# Patient Record
Sex: Female | Born: 1995 | Race: Black or African American | Hispanic: No | Marital: Single | Smoking: Never smoker
Health system: Southern US, Community
[De-identification: ages and names within clinical notes are randomized; demographics above are authoritative.]

## PROBLEM LIST (undated history)

## (undated) DIAGNOSIS — F1010 Alcohol abuse, uncomplicated: Secondary | ICD-10-CM

## (undated) DIAGNOSIS — Z789 Other specified health status: Secondary | ICD-10-CM

## (undated) HISTORY — PX: NO PAST SURGERIES: SHX2092

---

## 2019-06-04 ENCOUNTER — Other Ambulatory Visit: Payer: Self-pay

## 2019-06-04 ENCOUNTER — Inpatient Hospital Stay (HOSPITAL_COMMUNITY): Payer: Medicaid Other

## 2019-06-04 ENCOUNTER — Observation Stay (HOSPITAL_COMMUNITY)
Admission: AD | Admit: 2019-06-04 | Discharge: 2019-06-07 | Disposition: A | Discharge status: Home / Self Care | Payer: Medicaid Other | Attending: Obstetrics & Gynecology | Admitting: Obstetrics & Gynecology

## 2019-06-04 ENCOUNTER — Encounter (HOSPITAL_COMMUNITY): Payer: Self-pay | Admitting: Student

## 2019-06-04 DIAGNOSIS — Z1159 Encounter for screening for other viral diseases: Secondary | ICD-10-CM | POA: Insufficient documentation

## 2019-06-04 DIAGNOSIS — R109 Unspecified abdominal pain: Secondary | ICD-10-CM | POA: Diagnosis present

## 2019-06-04 DIAGNOSIS — O210 Mild hyperemesis gravidarum: Principal | ICD-10-CM | POA: Diagnosis present

## 2019-06-04 DIAGNOSIS — F1290 Cannabis use, unspecified, uncomplicated: Secondary | ICD-10-CM | POA: Diagnosis not present

## 2019-06-04 DIAGNOSIS — M549 Dorsalgia, unspecified: Secondary | ICD-10-CM | POA: Diagnosis present

## 2019-06-04 DIAGNOSIS — Z3A10 10 weeks gestation of pregnancy: Secondary | ICD-10-CM | POA: Diagnosis not present

## 2019-06-04 DIAGNOSIS — O99321 Drug use complicating pregnancy, first trimester: Secondary | ICD-10-CM | POA: Insufficient documentation

## 2019-06-04 DIAGNOSIS — O26891 Other specified pregnancy related conditions, first trimester: Secondary | ICD-10-CM | POA: Diagnosis not present

## 2019-06-04 DIAGNOSIS — R400 Somnolence: Secondary | ICD-10-CM | POA: Diagnosis present

## 2019-06-04 DIAGNOSIS — R1013 Epigastric pain: Secondary | ICD-10-CM | POA: Diagnosis not present

## 2019-06-04 DIAGNOSIS — Z3689 Encounter for other specified antenatal screening: Secondary | ICD-10-CM | POA: Diagnosis not present

## 2019-06-04 DIAGNOSIS — O219 Vomiting of pregnancy, unspecified: Secondary | ICD-10-CM | POA: Diagnosis not present

## 2019-06-04 HISTORY — DX: Other specified health status: Z78.9

## 2019-06-04 HISTORY — DX: Alcohol abuse, uncomplicated: F10.10

## 2019-06-04 LAB — CBC
HCT: 38.1 % (ref 36.0–46.0)
HCT: 38.4 % (ref 36.0–46.0)
Hemoglobin: 13 g/dL (ref 12.0–15.0)
Hemoglobin: 13.1 g/dL (ref 12.0–15.0)
MCH: 27.5 pg (ref 26.0–34.0)
MCH: 27.6 pg (ref 26.0–34.0)
MCHC: 34.1 g/dL (ref 30.0–36.0)
MCHC: 34.1 g/dL (ref 30.0–36.0)
MCV: 80.7 fL (ref 80.0–100.0)
MCV: 81 fL (ref 80.0–100.0)
Platelets: 276 10*3/uL (ref 150–400)
Platelets: 271 10*3/uL (ref 150–400)
RBC: 4.72 MIL/uL (ref 3.87–5.11)
RBC: 4.74 MIL/uL (ref 3.87–5.11)
RDW: 14 % (ref 11.5–15.5)
RDW: 14.1 % (ref 11.5–15.5)
WBC: 17 10*3/uL — ABNORMAL HIGH (ref 4.0–10.5)
WBC: 16.8 10*3/uL — ABNORMAL HIGH (ref 4.0–10.5)
nRBC: 0 % (ref 0.0–0.2)
nRBC: 0 % (ref 0.0–0.2)

## 2019-06-04 LAB — COMPREHENSIVE METABOLIC PANEL
ALT: 47 U/L — ABNORMAL HIGH (ref 0–44)
AST: 21 U/L (ref 15–41)
Albumin: 4.2 g/dL (ref 3.5–5.0)
Alkaline Phosphatase: 57 U/L (ref 38–126)
Anion gap: 10 (ref 5–15)
BUN: 7 mg/dL (ref 6–20)
CO2: 25 mmol/L (ref 22–32)
Calcium: 9.6 mg/dL (ref 8.9–10.3)
Chloride: 99 mmol/L (ref 98–111)
Creatinine, Ser: 0.6 mg/dL (ref 0.44–1.00)
GFR calc Af Amer: >60 mL/min (ref >60)
GFR calc non Af Amer: >60 mL/min (ref >60)
Glucose, Bld: 107 mg/dL — ABNORMAL HIGH (ref 70–99)
Potassium: 3.1 mmol/L — ABNORMAL LOW (ref 3.5–5.1)
Sodium: 134 mmol/L — ABNORMAL LOW (ref 135–145)
Total Bilirubin: 0.3 mg/dL (ref 0.3–1.2)
Total Protein: 7.4 g/dL (ref 6.5–8.1)

## 2019-06-04 LAB — URINALYSIS, ROUTINE W REFLEX MICROSCOPIC
Bilirubin Urine: NEGATIVE
Glucose, UA: NEGATIVE mg/dL
Hgb urine dipstick: NEGATIVE
Ketones, ur: 80 mg/dL — AB
Nitrite: NEGATIVE
Protein, ur: 100 mg/dL — AB
Specific Gravity, Urine: 1.031 — ABNORMAL HIGH (ref 1.005–1.030)
pH: 5 (ref 5.0–8.0)

## 2019-06-04 LAB — DIFFERENTIAL
Abs Immature Granulocytes: 0.09 10*3/uL — ABNORMAL HIGH (ref 0.00–0.07)
Basophils Absolute: 0 10*3/uL (ref 0.0–0.1)
Basophils Relative: 0 %
Eosinophils Absolute: 0 10*3/uL (ref 0.0–0.5)
Eosinophils Relative: 0 %
Immature Granulocytes: 1 %
Lymphocytes Relative: 9 %
Lymphs Abs: 1.5 10*3/uL (ref 0.7–4.0)
Monocytes Absolute: 0.6 10*3/uL (ref 0.1–1.0)
Monocytes Relative: 3 %
Neutro Abs: 14.6 10*3/uL — ABNORMAL HIGH (ref 1.7–7.7)
Neutrophils Relative %: 87 %

## 2019-06-04 LAB — BLOOD GAS, ARTERIAL
Acid-Base Excess: 1.6 mmol/L (ref 0.0–2.0)
Bicarbonate: 24.3 mmol/L (ref 20.0–28.0)
Drawn by: 560071
O2 Saturation: 98.4 %
pCO2 arterial: 32.9 mmHg (ref 32.0–48.0)
pH, Arterial: 7.482 — ABNORMAL HIGH (ref 7.350–7.450)
pO2, Arterial: 92.3 mmHg (ref 83.0–108.0)

## 2019-06-04 LAB — RAPID URINE DRUG SCREEN, HOSP PERFORMED
Amphetamines: NOT DETECTED
Barbiturates: NOT DETECTED
Benzodiazepines: NOT DETECTED
Cocaine: NOT DETECTED
Opiates: NOT DETECTED
Tetrahydrocannabinol: POSITIVE — AB

## 2019-06-04 LAB — WET PREP, GENITAL
Sperm: NONE SEEN
Trich, Wet Prep: NONE SEEN
Yeast Wet Prep HPF POC: NONE SEEN

## 2019-06-04 LAB — LIPASE, BLOOD: Lipase: 60 U/L — ABNORMAL HIGH (ref 11–51)

## 2019-06-04 LAB — LACTIC ACID, PLASMA: Lactic Acid, Venous: 1.2 mmol/L (ref 0.5–1.9)

## 2019-06-04 LAB — AMYLASE: Amylase: 129 U/L — ABNORMAL HIGH (ref 28–100)

## 2019-06-04 LAB — C-REACTIVE PROTEIN: CRP: <0.8 mg/dL (ref <1.0)

## 2019-06-04 LAB — AMMONIA: Ammonia: 31 umol/L (ref 9–35)

## 2019-06-04 LAB — TRIGLYCERIDES
Triglycerides: 53 mg/dL (ref <150)
Triglycerides: 57 mg/dL (ref <150)

## 2019-06-04 LAB — SARS CORONAVIRUS 2 BY RT PCR (HOSPITAL ORDER, PERFORMED IN ~~LOC~~ HOSPITAL LAB): SARS Coronavirus 2: NEGATIVE

## 2019-06-04 LAB — MAGNESIUM: Magnesium: 1.7 mg/dL (ref 1.7–2.4)

## 2019-06-04 LAB — ETHANOL: Alcohol, Ethyl (B): <10 mg/dL (ref <10)

## 2019-06-04 MED ORDER — ACETAMINOPHEN 325 MG PO TABS: 650.0000 mg | ORAL_TABLET | ORAL | Status: DC | PRN

## 2019-06-04 MED ORDER — POTASSIUM CHLORIDE CRYS ER 20 MEQ PO TBCR
20.0000 meq | EXTENDED_RELEASE_TABLET | Freq: Two times a day (BID) | ORAL | Status: DC
  Administered 2019-06-05 – 2019-06-07 (×6): 20 meq via ORAL
  Filled 2019-06-04 (×7): qty 1

## 2019-06-04 MED ORDER — FAMOTIDINE IN NACL 20-0.9 MG/50ML-% IV SOLN
20.0000 mg | Freq: Once | INTRAVENOUS | Status: AC
  Administered 2019-06-04: 20 mg via INTRAVENOUS
  Filled 2019-06-04: qty 50

## 2019-06-04 MED ORDER — LACTATED RINGERS IV BOLUS
1000.0000 mL | Freq: Once | INTRAVENOUS | Status: AC
  Administered 2019-06-04: 1000 mL via INTRAVENOUS

## 2019-06-04 MED ORDER — DOCUSATE SODIUM 100 MG PO CAPS
100.0000 mg | ORAL_CAPSULE | Freq: Every day | ORAL | Status: DC
  Administered 2019-06-05 – 2019-06-07 (×3): 100 mg via ORAL
  Filled 2019-06-04 (×3): qty 1

## 2019-06-04 MED ORDER — PROMETHAZINE HCL 25 MG/ML IJ SOLN
25.0000 mg | Freq: Four times a day (QID) | INTRAMUSCULAR | Status: DC | PRN
  Administered 2019-06-04: 25 mg via INTRAVENOUS
  Filled 2019-06-04: qty 1

## 2019-06-04 MED ORDER — LACTATED RINGERS IV SOLN
INTRAVENOUS | Status: DC
  Administered 2019-06-04 – 2019-06-07 (×7): via INTRAVENOUS

## 2019-06-04 MED ORDER — PANTOPRAZOLE SODIUM 40 MG IV SOLR
40.0000 mg | INTRAVENOUS | Status: DC
  Administered 2019-06-05: 40 mg via INTRAVENOUS

## 2019-06-04 MED ORDER — CALCIUM CARBONATE ANTACID 500 MG PO CHEW
2.0000 | CHEWABLE_TABLET | ORAL | Status: DC | PRN
  Administered 2019-06-05: 400 mg via ORAL
  Filled 2019-06-04: qty 2

## 2019-06-04 MED ORDER — PRENATAL MULTIVITAMIN CH
1.0000 | ORAL_TABLET | Freq: Every day | ORAL | Status: DC
  Administered 2019-06-05 – 2019-06-07 (×3): 1 via ORAL
  Filled 2019-06-04 (×3): qty 1

## 2019-06-04 MED ORDER — CYCLOBENZAPRINE HCL 10 MG PO TABS
10.0000 mg | ORAL_TABLET | Freq: Three times a day (TID) | ORAL | Status: DC | PRN
  Filled 2019-06-04: qty 1

## 2019-06-04 NOTE — Progress Notes (Signed)
Pt arrived via wheelchair accompanied by NT from MAU to room 102.

## 2019-06-04 NOTE — ED Triage Notes (Signed)
Transport notified to take to MAU-- MAU notified

## 2019-06-04 NOTE — MAU Note (Signed)
Went into room to update pt history. Pt very sleepy and lethargic. Verbally responds after shaking pts arm. Still sleepy and dosing off during interview.

## 2019-06-04 NOTE — ED Notes (Signed)
Called for Patient 2 times no answer.

## 2019-06-04 NOTE — ED Provider Notes (Signed)
MSE was initiated and I personally evaluated the patient and placed orders (if any) at  2:53 PM on June 04, 2019.  Natalie Henson is a 23 y.o. female who reports positive pregnancy test at [facility] ED 1 week ago.  She presents today reporting periumbilical abdominal pain and back pain as well as nausea and vomiting.  She has not had any vaginal bleeding.  Vitals are stable.  Patient is in no acute distress.  Feel she is stable for evaluation at MAU as a pregnant patient.  I called and discussed this with MAU NP Erin who is in agreement and will see and treat patient.  Patient will be transferred to [facility] at this time.  The patient appears stable so that the remainder of the MSE may be completed by another provider.   Jacqlyn Larsen, PA-C 06/04/19 [place], MD 06/08/19 [id number]

## 2019-06-04 NOTE — MAU Provider Note (Addendum)
History     CSN: [id number]  Arrival date and time: 06/04/19 1416   First Provider Initiated Contact with Patient 06/04/19 1649      Chief Complaint  Patient presents with  . Abdominal Pain   23 y.o. G2P1001 @10 .2 wls presenting with N/V, abdominal and back pain. Sx started 8 days ago. She was seen in another ED and given Diclegis but states is not working. Reports 6 episodes of emesis today. Cannot tolerate food or fluids. Abd pain is located in epigastric region. Rates 10/10. Endorses back pain, down the spine. Denies falls or injuries. Rates 10/10. Took Tylenol a few days ago. Denies urinary sx. No VB. Admit to MJ use to help appetite. Used yesterday. Hx of ETOH abuse.  OB History    Gravida  2   Para  1   Term  1   Preterm      AB      Living  1     SAB      TAB      Ectopic      Multiple      Live Births  1           Past Medical History:  Diagnosis Date  . Alcohol abuse   . Medical history non-contributory     Past Surgical History:  Procedure Laterality Date  . NO PAST SURGERIES      No family history on file.  Social History   Tobacco Use  . Smoking status: Never Smoker  Substance Use Topics  . Alcohol use: Never    Frequency: Never  . Drug use: Yes    Types: Marijuana    Comment: used 2 days ago    Allergies: No Known Allergies  No medications prior to admission.    Review of Systems  Constitutional: Negative for chills and fever.  Gastrointestinal: Positive for abdominal pain, nausea and vomiting.  Genitourinary: Negative for dysuria, hematuria, urgency, vaginal bleeding and vaginal discharge.  Musculoskeletal: Positive for back pain.   Physical Exam   Blood pressure 127/63, pulse 93, temperature 99.2 F (37.3 C), resp. rate 18, weight 75.8 kg, last menstrual period 03/09/2019, SpO2 100 %.  Physical Exam  Nursing note and vitals reviewed. Constitutional: She is oriented to person, place, and time. She appears  well-developed and well-nourished. She appears lethargic. No distress.  HENT:  Head: Normocephalic and atraumatic.  Neck: Normal range of motion.  Cardiovascular: Normal rate.  Respiratory: Effort normal. No respiratory distress.  GI: Soft. She exhibits no distension and no mass. There is abdominal tenderness in the epigastric area. There is no rebound and no guarding.  Musculoskeletal: Normal range of motion.     Cervical back: Normal.     Thoracic back: Normal. She exhibits no deformity.     Lumbar back: She exhibits tenderness. She exhibits no deformity.  Neurological: She is oriented to person, place, and time. She appears lethargic.  Skin: Skin is warm and dry.  Psychiatric: She has a normal mood and affect.  Bedside US: Viable IUP, FHR 172  Results for orders placed or performed during the hospital encounter of 06/04/19 (from the past 24 hour(s))  Urinalysis, Routine w reflex microscopic     Status: Abnormal   Collection Time: 06/04/19  4:10 PM  Result Value Ref Range   Color, Urine AMBER (A) YELLOW   APPearance HAZY (A) CLEAR   Specific Gravity, Urine 1.031 (H) 1.005 - 1.030   pH 5.0 5.0 - 8.0  Glucose, UA NEGATIVE NEGATIVE mg/dL   Hgb urine dipstick NEGATIVE NEGATIVE   Bilirubin Urine NEGATIVE NEGATIVE   Ketones, ur 80 (A) NEGATIVE mg/dL   Protein, ur 161100 (A) NEGATIVE mg/dL   Nitrite NEGATIVE NEGATIVE   Leukocytes,Ua TRACE (A) NEGATIVE   RBC / HPF 0-5 0 - 5 RBC/hpf   WBC, UA 11-20 0 - 5 WBC/hpf   Bacteria, UA RARE (A) NONE SEEN   Squamous Epithelial / LPF 11-20 0 - 5   Mucus PRESENT   Urine rapid drug screen (hosp performed)     Status: Abnormal   Collection Time: 06/04/19  5:02 PM  Result Value Ref Range   Opiates NONE DETECTED NONE DETECTED   Cocaine NONE DETECTED NONE DETECTED   Benzodiazepines NONE DETECTED NONE DETECTED   Amphetamines NONE DETECTED NONE DETECTED   Tetrahydrocannabinol POSITIVE (A) NONE DETECTED   Barbiturates NONE DETECTED NONE DETECTED  CBC      Status: Abnormal   Collection Time: 06/04/19  5:50 PM  Result Value Ref Range   WBC 17.0 (H) 4.0 - 10.5 K/uL   RBC 4.72 3.87 - 5.11 MIL/uL   Hemoglobin 13.0 12.0 - 15.0 g/dL   HCT 09.638.1 04.536.0 - 40.946.0 %   MCV 80.7 80.0 - 100.0 fL   MCH 27.5 26.0 - 34.0 pg   MCHC 34.1 30.0 - 36.0 g/dL   RDW 81.114.0 91.411.5 - 78.215.5 %   Platelets 276 150 - 400 K/uL   nRBC 0.0 0.0 - 0.2 %  Comprehensive metabolic panel     Status: Abnormal   Collection Time: 06/04/19  5:50 PM  Result Value Ref Range   Sodium 134 (L) 135 - 145 mmol/L   Potassium 3.1 (L) 3.5 - 5.1 mmol/L   Chloride 99 98 - 111 mmol/L   CO2 25 22 - 32 mmol/L   Glucose, Bld 107 (H) 70 - 99 mg/dL   BUN 7 6 - 20 mg/dL   Creatinine, Ser 9.560.60 0.44 - 1.00 mg/dL   Calcium 9.6 8.9 - 21.310.3 mg/dL   Total Protein 7.4 6.5 - 8.1 g/dL   Albumin 4.2 3.5 - 5.0 g/dL   AST 21 15 - 41 U/L   ALT 47 (H) 0 - 44 U/L   Alkaline Phosphatase 57 38 - 126 U/L   Total Bilirubin 0.3 0.3 - 1.2 mg/dL   GFR calc non Af Amer >60 >60 mL/min   GFR calc Af Amer >60 >60 mL/min   Anion gap 10 5 - 15  Lipase, blood     Status: Abnormal   Collection Time: 06/04/19  5:50 PM  Result Value Ref Range   Lipase 60 (H) 11 - 51 U/L  Ethanol     Status: None   Collection Time: 06/04/19  5:50 PM  Result Value Ref Range   Alcohol, Ethyl (B) <10 <10 mg/dL  Wet prep, genital     Status: Abnormal   Collection Time: 06/04/19  6:51 PM   Specimen: Vaginal  Result Value Ref Range   Yeast Wet Prep HPF POC NONE SEEN NONE SEEN   Trich, Wet Prep NONE SEEN NONE SEEN   Clue Cells Wet Prep HPF POC PRESENT (A) NONE SEEN   WBC, Wet Prep HPF POC MANY (A) NONE SEEN   Sperm NONE SEEN   Differential     Status: Abnormal   Collection Time: 06/04/19  8:14 PM  Result Value Ref Range   Neutrophils Relative % 87 %   Neutro Abs 14.6 (H) 1.7 -  7.7 K/uL   Lymphocytes Relative 9 %   Lymphs Abs 1.5 0.7 - 4.0 K/uL   Monocytes Relative 3 %   Monocytes Absolute 0.6 0.1 - 1.0 K/uL   Eosinophils Relative 0 %    Eosinophils Absolute 0.0 0.0 - 0.5 K/uL   Basophils Relative 0 %   Basophils Absolute 0.0 0.0 - 0.1 K/uL   Immature Granulocytes 1 %   Abs Immature Granulocytes 0.09 (H) 0.00 - 0.07 K/uL  CBC     Status: Abnormal   Collection Time: 06/04/19  8:14 PM  Result Value Ref Range   WBC 16.8 (H) 4.0 - 10.5 K/uL   RBC 4.74 3.87 - 5.11 MIL/uL   Hemoglobin 13.1 12.0 - 15.0 g/dL   HCT 40.938.4 81.136.0 - 91.446.0 %   MCV 81.0 80.0 - 100.0 fL   MCH 27.6 26.0 - 34.0 pg   MCHC 34.1 30.0 - 36.0 g/dL   RDW 78.214.1 95.611.5 - 21.315.5 %   Platelets 271 150 - 400 K/uL   nRBC 0.0 0.0 - 0.2 %   [place] Abdomen Complete  Result Date: 06/04/2019 CLINICAL DATA:  Abdominal pain, nausea, and vomiting. EXAM: ABDOMEN ULTRASOUND COMPLETE COMPARISON:  None. FINDINGS: Examination was technically difficult due to motion artifact as well as patient's inability to take a deep breath. Gallbladder: No gallstones or wall thickening visualized. No sonographic Murphy sign noted by sonographer. Common bile duct: Diameter: 2 mm Liver: No focal lesion identified. Within normal limits in parenchymal echogenicity. Portal vein is patent on color Doppler imaging with normal direction of blood flow towards the liver. IVC: No abnormality visualized. Pancreas: Visualized portion unremarkable. Spleen: Size and appearance within normal limits. Right Kidney: Length: 12.1 cm. Echogenicity within normal limits. No mass or hydronephrosis visualized. Left Kidney: Length: 11.1 cm. Echogenicity within normal limits. No mass or hydronephrosis visualized. Abdominal aorta: No aneurysm visualized. Other findings: None. IMPRESSION: Negative abdominal ultrasound. Electronically Signed   By: Sebastian AcheAllen  Grady M.D.   On: 06/04/2019 20:22    MAU Course  Procedures Orders Placed This Encounter  Procedures  . Wet prep, genital    Standing Status:   Standing    Number of Occurrences:   1    Order Specific Question:   Patient immune status    Answer:   Normal  . Culture, OB Urine     Standing Status:   Standing    Number of Occurrences:   1  . US Abdomen Complete    Standing Status:   Standing    Number of Occurrences:   1    Order Specific Question:   Reason for Exam (SYMPTOM  OR DIAGNOSIS REQUIRED)    Answer:   abd pain, N/V  . Urinalysis, Routine w reflex microscopic    Standing Status:   Standing    Number of Occurrences:   1  . CBC    Standing Status:   Standing    Number of Occurrences:   1  . Comprehensive metabolic panel    Standing Status:   Standing    Number of Occurrences:   1  . Lipase, blood    Standing Status:   Standing    Number of Occurrences:   1  . Urine rapid drug screen (hosp performed)    Standing Status:   Standing    Number of Occurrences:   1  . Ethanol    Standing Status:   Standing    Number of Occurrences:   1  . Differential  Standing Status:   Standing    Number of Occurrences:   1  . Amylase    Standing Status:   Standing    Number of Occurrences:   1  . CBC    Standing Status:   Standing    Number of Occurrences:   1   Meds ordered this encounter  Medications  . lactated ringers bolus 1,000 mL  . promethazine (PHENERGAN) injection 25 mg  . famotidine (PEPCID) IVPB 20 mg premix  . DISCONTD: cyclobenzaprine (FLEXERIL) tablet 10 mg   MDM Labs and [place] ordered. Transfer of care given to Betty Daidone, [facility] Julianne Handler, CNM  06/04/2019 8:37 PM   -care assumed of patient @2037  -called and spoke with Dr. Rosana Hoes @2040  re: elevated amylase and normal abdominal US, per Dr. Rosana Hoes, will order ammonia and drug panel by serum as well as additional bag of LR -Dr. Rosana Hoes at bedside @2130  to evaluate pt in person, per Dr. Rosana Hoes, will consult with family medicine and admit patient to [facility] specialty care for observation and additional labs/testing  Assessment and Plan   -pt admitted to [facility] specialty care  Cristel Rail, Gerrie Nordmann, NP  10:14 PM 06/04/2019

## 2019-06-04 NOTE — H&P (Signed)
Obstetric History and Physical  Natalie Henson is a 23 y.o. G2P1001 with IUP at 422w2d presenting for nausea, vomiting, abdominal pain and back pain for about a week. Was seen in another ED and given diclegis but this has not helped. States she has been very nauseous and has been unable to keep down food, although on further questioning, she reports she had dinner last night. Was able to keep down dinner last night but reports pain in stomach has been increasing, rated it 10/10 tonight. Nothing improves her pain.   Also reports upper back pain. Took 1000 mg tylenol today for back pain but it did not help. Reports she has never had back pain like this. Denies trauma/fall. Denies IVDA. Does smoke marijuana, last was about 3 days ago to help with nausea. H/o heavy alcohol use, reports last time she had alcohol was when she was in the hospital for alcohol poisoning, which she thinks was earlier this year but unable to give good history. Told staff earlier she has not had alcohol in 2 months.   Denies vaginal bleeding or cramping, has not had any care in pregnancy thus far. [facility] with singleton IUP at 6572w2d on 05/28/19.   Prenatal Course Source of Care: none Pregnancy complications or risks:There are no active problems to display for this patient.  Medical History:  Past Medical History:  Diagnosis Date  . Alcohol abuse   . Medical history non-contributory     Past Surgical History:  Procedure Laterality Date  . NO PAST SURGERIES      OB History  Gravida Para Term Preterm AB Living  2 1 1     1   SAB TAB Ectopic Multiple Live Births          1    # Outcome Date GA Lbr Len/2nd Weight Sex Delivery Anes PTL Lv  2 Current           1 Term 04/14/17 5029w0d  3705 g F Vag-Spont EPI N LIV     Complications: Delayed postpartum hemorrhage    Social History   Socioeconomic History  . Marital status: Single    Spouse name: Not on file  . Number of children: Not on  file  . Years of education: Not on file  . Highest education level: Not on file  Occupational History  . Not on file  Social Needs  . Financial resource strain: Not on file  . Food insecurity    Worry: Not on file    Inability: Not on file  . Transportation needs    Medical: Not on file    Non-medical: Not on file  Tobacco Use  . Smoking status: Never Smoker  Substance and Sexual Activity  . Alcohol use: Never    Frequency: Never  . Drug use: Yes    Types: Marijuana    Comment: used 2 days ago  . Sexual activity: Not on file  Lifestyle  . Physical activity    Days per week: Not on file    Minutes per session: Not on file  . Stress: Not on file  Relationships  . Social Musicianconnections    Talks on phone: Not on file    Gets together: Not on file    Attends religious service: Not on file    Active member of club or organization: Not on file    Attends meetings of clubs or organizations: Not on file    Relationship status:  Not on file  Other Topics Concern  . Not on file  Social History Narrative  . Not on file   No family history on file.  No medications prior to admission.   No Known Allergies  Review of Systems: Negative except for what is mentioned in HPI.  Physical Exam: BP 127/63   Pulse 93   Temp 99.2 F (37.3 C)   Resp 18   Wt 75.8 kg   LMP 03/09/2019   SpO2 100%  CONSTITUTIONAL: Well-developed, well-nourished female in mild distress with movements, extremely somnolent on arrival, somewhat difficult to awaken but once awake, she is more interactive HENT:  Normocephalic, atraumatic, External right and left ear normal. Oropharynx is clear and moist EYES: Conjunctivae and EOM are normal. Pupils are equal, round, and reactive to light. Eyes appeared to be rolling back in head at one point while speaking with patient but she was able to focus and respond NECK: Normal range of motion, supple, no masses SKIN: Skin is warm and dry. No rash noted. Not diaphoretic.  No erythema. No pallor. NEUROLOGIC: Alert and oriented to person, place, and time. Normal reflexes, muscle tone coordination. No cranial nerve deficit noted. PSYCHIATRIC: Normal mood and affect. Normal judgment and thought content. CARDIOVASCULAR: Normal heart rate noted RESPIRATORY: Effort normal, no problems with respiration noted ABDOMEN: Soft but tender in RUQ and epigastric area, nondistended. Severe back tenderness approx 10 cm long along spine in mid-back, no paraspinal tenderness, no CVA tenderness, no visible abscess MUSCULOSKELETAL: Normal range of motion. No edema and no tenderness. 2+ distal pulses.  Pertinent Labs/Studies:   Results for orders placed or performed during the hospital encounter of 06/04/19 (from the past 24 hour(s))  Urinalysis, Routine w reflex microscopic     Status: Abnormal   Collection Time: 06/04/19  4:10 PM  Result Value Ref Range   Color, Urine AMBER (A) YELLOW   APPearance HAZY (A) CLEAR   Specific Gravity, Urine 1.031 (H) 1.005 - 1.030   pH 5.0 5.0 - 8.0   Glucose, UA NEGATIVE NEGATIVE mg/dL   Hgb urine dipstick NEGATIVE NEGATIVE   Bilirubin Urine NEGATIVE NEGATIVE   Ketones, ur 80 (A) NEGATIVE mg/dL   Protein, ur 161100 (A) NEGATIVE mg/dL   Nitrite NEGATIVE NEGATIVE   Leukocytes,Ua TRACE (A) NEGATIVE   RBC / HPF 0-5 0 - 5 RBC/hpf   WBC, UA 11-20 0 - 5 WBC/hpf   Bacteria, UA RARE (A) NONE SEEN   Squamous Epithelial / LPF 11-20 0 - 5   Mucus PRESENT   Urine rapid drug screen (hosp performed)     Status: Abnormal   Collection Time: 06/04/19  5:02 PM  Result Value Ref Range   Opiates NONE DETECTED NONE DETECTED   Cocaine NONE DETECTED NONE DETECTED   Benzodiazepines NONE DETECTED NONE DETECTED   Amphetamines NONE DETECTED NONE DETECTED   Tetrahydrocannabinol POSITIVE (A) NONE DETECTED   Barbiturates NONE DETECTED NONE DETECTED  CBC     Status: Abnormal   Collection Time: 06/04/19  5:50 PM  Result Value Ref Range   WBC 17.0 (H) 4.0 - 10.5 K/uL    RBC 4.72 3.87 - 5.11 MIL/uL   Hemoglobin 13.0 12.0 - 15.0 g/dL   HCT 09.638.1 04.536.0 - 40.946.0 %   MCV 80.7 80.0 - 100.0 fL   MCH 27.5 26.0 - 34.0 pg   MCHC 34.1 30.0 - 36.0 g/dL   RDW 81.114.0 91.411.5 - 78.215.5 %   Platelets 276 150 - 400 K/uL  nRBC 0.0 0.0 - 0.2 %  Comprehensive metabolic panel     Status: Abnormal   Collection Time: 06/04/19  5:50 PM  Result Value Ref Range   Sodium 134 (L) 135 - 145 mmol/L   Potassium 3.1 (L) 3.5 - 5.1 mmol/L   Chloride 99 98 - 111 mmol/L   CO2 25 22 - 32 mmol/L   Glucose, Bld 107 (H) 70 - 99 mg/dL   BUN 7 6 - 20 mg/dL   Creatinine, Ser 0.60 0.44 - 1.00 mg/dL   Calcium 9.6 8.9 - 10.3 mg/dL   Total Protein 7.4 6.5 - 8.1 g/dL   Albumin 4.2 3.5 - 5.0 g/dL   AST 21 15 - 41 U/L   ALT 47 (H) 0 - 44 U/L   Alkaline Phosphatase 57 38 - 126 U/L   Total Bilirubin 0.3 0.3 - 1.2 mg/dL   GFR calc non Af Amer >60 >60 mL/min   GFR calc Af Amer >60 >60 mL/min   Anion gap 10 5 - 15  Lipase, blood     Status: Abnormal   Collection Time: 06/04/19  5:50 PM  Result Value Ref Range   Lipase 60 (H) 11 - 51 U/L  Ethanol     Status: None   Collection Time: 06/04/19  5:50 PM  Result Value Ref Range   Alcohol, Ethyl (B) <10 <10 mg/dL  Wet prep, genital     Status: Abnormal   Collection Time: 06/04/19  6:51 PM   Specimen: Vaginal  Result Value Ref Range   Yeast Wet Prep HPF POC NONE SEEN NONE SEEN   Trich, Wet Prep NONE SEEN NONE SEEN   Clue Cells Wet Prep HPF POC PRESENT (A) NONE SEEN   WBC, Wet Prep HPF POC MANY (A) NONE SEEN   Sperm NONE SEEN   Differential     Status: Abnormal   Collection Time: 06/04/19  8:14 PM  Result Value Ref Range   Neutrophils Relative % 87 %   Neutro Abs 14.6 (H) 1.7 - 7.7 K/uL   Lymphocytes Relative 9 %   Lymphs Abs 1.5 0.7 - 4.0 K/uL   Monocytes Relative 3 %   Monocytes Absolute 0.6 0.1 - 1.0 K/uL   Eosinophils Relative 0 %   Eosinophils Absolute 0.0 0.0 - 0.5 K/uL   Basophils Relative 0 %   Basophils Absolute 0.0 0.0 - 0.1 K/uL    Immature Granulocytes 1 %   Abs Immature Granulocytes 0.09 (H) 0.00 - 0.07 K/uL  Amylase     Status: Abnormal   Collection Time: 06/04/19  8:14 PM  Result Value Ref Range   Amylase 129 (H) 28 - 100 U/L  CBC     Status: Abnormal   Collection Time: 06/04/19  8:14 PM  Result Value Ref Range   WBC 16.8 (H) 4.0 - 10.5 K/uL   RBC 4.74 3.87 - 5.11 MIL/uL   Hemoglobin 13.1 12.0 - 15.0 g/dL   HCT 38.4 36.0 - 46.0 %   MCV 81.0 80.0 - 100.0 fL   MCH 27.6 26.0 - 34.0 pg   MCHC 34.1 30.0 - 36.0 g/dL   RDW 14.1 11.5 - 15.5 %   Platelets 271 150 - 400 K/uL   nRBC 0.0 0.0 - 0.2 %    Assessment : Natalie Henson is a 23 y.o. G2P1001 at [redacted]w[redacted]d being admitted for overnight observation for nausea/vomiting, abdominal and back pain. VSS but amylase/lipase/ALT mildly elevated and patient extremely somnolent. UDS and serum ethanol negative  [facility], patient denies other drug use. Concern for hyperemesis vs pancreatitis although neither would explain her somnolence, which reportedly was present prior to the phenergan she received. [facility]Fs, pain management and workup. Have consulted hospitalist service, will await further lab work and recommendations.   Plan:  IVFs Regular diet Pain control as needed hospitalist consult SARS swab   K. Therese SarahMeryl Ashrita Chrismer, M.D. Attending Center [facility])  06/04/2019, 9:41 PM

## 2019-06-04 NOTE — Consult Note (Addendum)
Consult Note   Natalie Natalahmea Swanger ZOX:[id number] DOB: 03/24/1996 DOA: 06/04/2019  PCP: Patient, No Pcp Per  Patient coming from: home   Chief Complaint: back and abdominal pain  HPI: Natalie Henson is a 23 y.o. female, G2P1001 @ 10+2 by recent u/s, with medical history significant for alcohol abuse, who presents with above.  Patient complains of episodic nausea and vomiting and epigastric pain. Has had 3 ED visits this past year (march, April, and July) for this complaint. For a little over a week the patient has had nausea and vomiting that progressed to epigastric pain. Vomit is food/liquid, no bile, once or twice saw small flecks of blood. No hx abd surgery and says is passing stool and flatus. No dysuria. Feels warm at times but hasn't checked temperature. No covid exposures, no cough or shortness of breath. Uses marijuana. Admitted elsewhere for "alcohol poisoning" earlier this year. Says has not had alcohol for several months. Denies other drug use, denies history IV drug use. No history recent trauma or spine trauma. Reports that what's most "new" about this episode of nausea/vomiting/abd pain is that she has had several days of severe back pain in area of spine between shoulder blades. Hurts all the time, worse with coughing, vomiting, or movement. Denies hx bloodstream infections, endocarditis, or the like. Denies history pancreatitis. Denies ha or neck stiffness. Also says has a history of some sort of irregular heart beat. Says occasionally feels palpitations. None for several days.   ED Course: labs, imaging  Review of Systems: As per HPI otherwise 10 point review of systems negative.    Past Medical History:  Diagnosis Date  . Alcohol abuse   . Medical history non-contributory     Past Surgical History:  Procedure Laterality Date  . NO PAST SURGERIES       reports that she has never smoked. She does not have any smokeless tobacco history on file. She reports current drug use.  Drug: Marijuana. She reports that she does not drink alcohol.  No Known Allergies  No family history on file.  Prior to Admission medications   Not on File    Physical Exam: Vitals:   06/04/19 1458 06/04/19 1544 06/04/19 1613 06/04/19 2035  BP: 119/80 125/77  127/63  Pulse: 86 87  93  Resp: 16 18    Temp: 98 F (36.7 C) 97.9 F (36.6 C)  99.2 F (37.3 C)  TempSrc: Oral     SpO2: 100% 100%    Weight:   75.8 kg     Constitutional: in mild distress. Initially somnolent and somewhat slurring words, more alert and quick to respond after a few minutes of questioning Head: Atraumatic Eyes: Conjunctiva clear ENM: Moist mucous membranes. Normal dentition.  Neck: Supple, able to touch chin to chest Respiratory: Clear to auscultation bilaterally, no wheezing/rales/rhonchi. Normal respiratory effort. No accessory muscle use. . Cardiovascular: Regular rate and rhythm. Soft systolic murmur Abdomen: soft, ttp in epigastrum, no ruq tenderness, no rebound or guarding, no suprapubic tenderness Musculoskeletal: No joint deformity upper and lower extremities. Normal ROM, no contractures. Normal muscle tone. Severe ttp over midline thoracic Skin: No rashes, lesions, or ulcers.  Extremities: No peripheral edema. Palpable peripheral pulses. Neurologic: somnolent then more alert, moving all 4 extremities. Psychiatric: see above   Labs on Admission: I have personally reviewed following labs and imaging studies  CBC: Recent Labs  Lab 06/04/19 1750 06/04/19 2014  WBC 17.0* 16.8*  NEUTROABS  --  14.6*  HGB 13.0 13.1  HCT 38.1 38.4  MCV 80.7 81.0  PLT 276 740   Basic Metabolic Panel: Recent Labs  Lab 06/04/19 1750  NA 134*  K 3.1*  CL 99  CO2 25  GLUCOSE 107*  BUN 7  CREATININE 0.60  CALCIUM 9.6   GFR: CrCl cannot be calculated (Unknown ideal weight.). Liver Function Tests: Recent Labs  Lab 06/04/19 1750  AST 21  ALT 47*  ALKPHOS 57  BILITOT 0.3  PROT 7.4  ALBUMIN 4.2    Recent Labs  Lab 06/04/19 1750 06/04/19 2014  LIPASE 60*  --   AMYLASE  --  129*   Recent Labs  Lab 06/04/19 2114  AMMONIA 31   Coagulation Profile: No results for input(s): INR, PROTIME in the last 168 hours. Cardiac Enzymes: No results for input(s): CKTOTAL, CKMB, CKMBINDEX, TROPONINI in the last 168 hours. BNP (last 3 results) No results for input(s): PROBNP in the last 8760 hours. HbA1C: No results for input(s): HGBA1C in the last 72 hours. CBG: No results for input(s): GLUCAP in the last 168 hours. Lipid Profile: No results for input(s): CHOL, HDL, LDLCALC, TRIG, CHOLHDL, LDLDIRECT in the last 72 hours. Thyroid Function Tests: No results for input(s): TSH, T4TOTAL, FREET4, T3FREE, THYROIDAB in the last 72 hours. Anemia Panel: No results for input(s): VITAMINB12, FOLATE, FERRITIN, TIBC, IRON, RETICCTPCT in the last 72 hours. Urine analysis:    Component Value Date/Time   COLORURINE AMBER (A) 06/04/2019 1610   APPEARANCEUR HAZY (A) 06/04/2019 1610   LABSPEC 1.031 (H) 06/04/2019 1610   PHURINE 5.0 06/04/2019 1610   GLUCOSEU NEGATIVE 06/04/2019 1610   HGBUR NEGATIVE 06/04/2019 1610   BILIRUBINUR NEGATIVE 06/04/2019 1610   KETONESUR 80 (A) 06/04/2019 1610   PROTEINUR 100 (A) 06/04/2019 1610   NITRITE NEGATIVE 06/04/2019 1610   LEUKOCYTESUR TRACE (A) 06/04/2019 1610    Radiological Exams on Admission: US Abdomen Complete  Result Date: 06/04/2019 CLINICAL DATA:  Abdominal pain, nausea, and vomiting. EXAM: ABDOMEN ULTRASOUND COMPLETE COMPARISON:  None. FINDINGS: Examination was technically difficult due to motion artifact as well as patient's inability to take a deep breath. Gallbladder: No gallstones or wall thickening visualized. No sonographic Murphy sign noted by sonographer. Common bile duct: Diameter: 2 mm Liver: No focal lesion identified. Within normal limits in parenchymal echogenicity. Portal vein is patent on color Doppler imaging with normal direction of  blood flow towards the liver. IVC: No abnormality visualized. Pancreas: Visualized portion unremarkable. Spleen: Size and appearance within normal limits. Right Kidney: Length: 12.1 cm. Echogenicity within normal limits. No mass or hydronephrosis visualized. Left Kidney: Length: 11.1 cm. Echogenicity within normal limits. No mass or hydronephrosis visualized. Abdominal aorta: No aneurysm visualized. Other findings: None. IMPRESSION: Negative abdominal ultrasound. Electronically Signed   By: Logan Bores M.D.   On: 06/04/2019 20:22     Assessment/Plan Active Problems:   Nausea/vomiting in pregnancy   # Epigastric pain with nausea/vomiting - likely nausea/vomiting of pregnancy. Hx recurrent bouts of similar pain in conjunction w/ chronic marijuana use suggests possible cyclic vomiting. That nausea/vomiting predated start of epigastric pain suggests possible mallory weiss tear. Hx heavy alcohol use also raises possible of ulcer disease. Abd u/s unremarkable. Trace bleeding with vomiting. Think mild lab abnormalities (hypokalemia, mildly elevated lipase and ALT) are likely sequelae of this nausea/vomiting. UDS and etoh neg - advise standard treatment of nausea/vomiting in pregnancy. No chest pain or doe to suggest cardiac etiology, though given hx a-fib may warrant ACS w/u if ekg abnormal or new cardiac  symptoms present - in addition advise starting acid suppression with pantoprazole 40 mg BID - consider GI consult if epigastric pain worsens despite adequate treatment of n/v, or if significant upper GI bleeding - f/u urine culture (no clear uti symptoms; u/a equivocal) - repeat CMP in AM  # Acute encephalopathy - somnolent and slurring speech on initial eval, though more alert and fluent after several minutes of questioning. No sig metabolic abnormalities on labs thus far, uds and ethanol level are neg. No apparent focal neurologic symptoms - osteo eval as below - ordering ammonia level, salicylates,  acetaminophen level, tsh - consider CT head if symptoms persist/worsen  # Thoracic back pain - several days of severe thoracic back pain with significant tenderness of thoracic spinous processes. No LE weakness, difficulty ambulating, bowel habit changes. Could be MSK 2/2 frequent heaving. - think prudent to check MRI to evaluate for problems such as osteomyelitis, and abscess. I have placed the order. Radiology thinks non-contrast study will be sufficient and is preferable given pregnancy - I have ordered 2 sets of blood culture and also SED and CRP  # Hypokalemia - likely 2/2 vomiting - replete potassium, given hx a-fib advise keeping K between 4 and 5 - f/u magnesium level, recommend keeping at or slightly above 2  # History atrial fibrillation - seen 01/2019 @ [facility] ED in setting of nausea and vomiting after binge drinking. Resolved to NSR in the ED and thought to be 2/2 acute illness. However, pt complains of intermittent palpitations - I have ordered an ekg (also helpful given hypokalemia) - advise telemetry monitoring, at least overnight  # Intrauterine pregnancy - defer mgmt to primary team    Silvano BilisNoah B Alyssa Mancera MD [facility] Pager [phone]  If 7PM-7AM, please contact night-coverage [email or web address] Password [email or web address]  06/04/2019, 10:22 PM

## 2019-06-04 NOTE — MAU Note (Signed)
Pt very sleepy after phenergan. When shaken wakes up but then falls right back to sleep.

## 2019-06-04 NOTE — MAU Note (Signed)
.   Natalie Henson is a 23 y.o. at [redacted]w[redacted]d here in MAU reporting: N/V back and abdominal pain, has taken aleve and states it has not helped, went to [facility] last week and was given nausea meds but states they are not helping  Onset of complaint: 1 week Pain score: 10 Vitals:   06/04/19 1458 06/04/19 1544  BP: 119/80 125/77  Pulse: 86 87  Resp: 16 18  Temp: 98 F (36.7 C) 97.9 F (36.6 C)  SpO2: 100% 100%     FHT: Lab orders placed from triage: UA

## 2019-06-05 ENCOUNTER — Inpatient Hospital Stay (HOSPITAL_COMMUNITY): Payer: Medicaid Other

## 2019-06-05 DIAGNOSIS — D72828 Other elevated white blood cell count: Secondary | ICD-10-CM | POA: Diagnosis not present

## 2019-06-05 DIAGNOSIS — E876 Hypokalemia: Secondary | ICD-10-CM

## 2019-06-05 DIAGNOSIS — M549 Dorsalgia, unspecified: Secondary | ICD-10-CM | POA: Diagnosis present

## 2019-06-05 DIAGNOSIS — R1013 Epigastric pain: Secondary | ICD-10-CM

## 2019-06-05 DIAGNOSIS — Z3A10 10 weeks gestation of pregnancy: Secondary | ICD-10-CM | POA: Diagnosis not present

## 2019-06-05 DIAGNOSIS — R109 Unspecified abdominal pain: Secondary | ICD-10-CM | POA: Diagnosis present

## 2019-06-05 DIAGNOSIS — R400 Somnolence: Secondary | ICD-10-CM | POA: Diagnosis present

## 2019-06-05 DIAGNOSIS — O219 Vomiting of pregnancy, unspecified: Secondary | ICD-10-CM | POA: Diagnosis not present

## 2019-06-05 LAB — ABO/RH: ABO/RH(D): B POS

## 2019-06-05 LAB — COMPREHENSIVE METABOLIC PANEL
ALT: 34 U/L (ref 0–44)
AST: 17 U/L (ref 15–41)
Albumin: 2.9 g/dL — ABNORMAL LOW (ref 3.5–5.0)
Alkaline Phosphatase: 39 U/L (ref 38–126)
Anion gap: 6 (ref 5–15)
BUN: <5 mg/dL — ABNORMAL LOW (ref 6–20)
CO2: 27 mmol/L (ref 22–32)
Calcium: 8.8 mg/dL — ABNORMAL LOW (ref 8.9–10.3)
Chloride: 104 mmol/L (ref 98–111)
Creatinine, Ser: 0.51 mg/dL (ref 0.44–1.00)
GFR calc Af Amer: >60 mL/min (ref >60)
GFR calc non Af Amer: >60 mL/min (ref >60)
Glucose, Bld: 104 mg/dL — ABNORMAL HIGH (ref 70–99)
Potassium: 3.1 mmol/L — ABNORMAL LOW (ref 3.5–5.1)
Sodium: 137 mmol/L (ref 135–145)
Total Bilirubin: 0.6 mg/dL (ref 0.3–1.2)
Total Protein: 5.4 g/dL — ABNORMAL LOW (ref 6.5–8.1)

## 2019-06-05 LAB — SALICYLATE LEVEL: Salicylate Lvl: <7 mg/dL (ref 2.8–30.0)

## 2019-06-05 LAB — MAGNESIUM: Magnesium: 1.7 mg/dL (ref 1.7–2.4)

## 2019-06-05 LAB — ACETAMINOPHEN LEVEL: Acetaminophen (Tylenol), Serum: <10 ug/mL — ABNORMAL LOW (ref 10–30)

## 2019-06-05 LAB — TYPE AND SCREEN
ABO/RH(D): B POS
Antibody Screen: NEGATIVE

## 2019-06-05 LAB — SEDIMENTATION RATE: Sed Rate: 12 mm/hr (ref 0–22)

## 2019-06-05 LAB — T4, FREE: Free T4: 1.2 ng/dL — ABNORMAL HIGH (ref 0.61–1.12)

## 2019-06-05 LAB — TSH: TSH: 0.118 u[IU]/mL — ABNORMAL LOW (ref 0.350–4.500)

## 2019-06-05 MED ORDER — OXYCODONE HCL 5 MG PO TABS
5.0000 mg | ORAL_TABLET | ORAL | Status: DC | PRN
  Administered 2019-06-05 – 2019-06-07 (×11): 5 mg via ORAL
  Filled 2019-06-05 (×12): qty 1

## 2019-06-05 MED ORDER — SIMETHICONE 80 MG PO CHEW
80.0000 mg | CHEWABLE_TABLET | Freq: Two times a day (BID) | ORAL | Status: DC
  Administered 2019-06-05 – 2019-06-07 (×5): 80 mg via ORAL
  Filled 2019-06-05 (×5): qty 1

## 2019-06-05 MED ORDER — PANTOPRAZOLE SODIUM 40 MG IV SOLR
40.0000 mg | Freq: Two times a day (BID) | INTRAVENOUS | Status: DC
  Administered 2019-06-05 – 2019-06-07 (×5): 40 mg via INTRAVENOUS
  Filled 2019-06-05 (×6): qty 40

## 2019-06-05 MED ORDER — LIDOCAINE 5 % EX PTCH
1.0000 | MEDICATED_PATCH | CUTANEOUS | Status: DC
  Administered 2019-06-05: 1 via TRANSDERMAL
  Filled 2019-06-05 (×3): qty 1

## 2019-06-05 MED ORDER — ENSURE ENLIVE PO LIQD
237.0000 mL | Freq: Two times a day (BID) | ORAL | Status: DC
  Administered 2019-06-06: 237 mL via ORAL
  Filled 2019-06-05 (×7): qty 237

## 2019-06-05 NOTE — Progress Notes (Addendum)
PROGRESS NOTE    Natalie Henson  QMV:[id number]  DOB: [birth year]  DOA: 06/04/2019 PCP: Patient, No Pcp Per  Medical consult f/u Subjective:  Patient states she is tolerating liquids and nausea better. She reports belching today. Did not much of regular diet as she didn't like the food. Family members bringing in. Has heat back bedside and reports improvement of back pain with oxycodone. She however states pain recurs when she lays on her back   Objective: Vitals:   06/04/19 2035 06/04/19 2301 06/05/19 0800 06/05/19 1200  BP: 127/63 136/80 120/64 116/64  Pulse: 93 88 93 (!) 102  Resp:  18 18 18   Temp: 99.2 F (37.3 C) 98.2 F (36.8 C) 98.4 F (36.9 C) 98.4 F (36.9 C)  TempSrc:  Oral Oral Oral  SpO2:  100% 100% 100%  Weight:  75.8 kg    Height:  5\' 7"  (1.702 m)      Intake/Output Summary (Last 24 hours) at 06/05/2019 1424 Last data filed at 06/05/2019 0600 Gross per 24 hour  Intake 1136.86 ml  Output 400 ml  Net 736.86 ml   Filed Weights   06/04/19 1613 06/04/19 2301  Weight: 75.8 kg 75.8 kg    Physical Examination:  General exam: Appears calm and comfortable  Respiratory system: Clear to auscultation. Respiratory effort normal. Cardiovascular system: S1 & S2 heard, RRR. No JVD, murmurs, rubs, gallops or clicks. No pedal edema. Gastrointestinal system: Abdomen is nondistended, soft and nontender. No organomegaly or masses felt. Normal bowel sounds heard. Central nervous system: Alert and oriented. No focal neurological deficits. Musculoskeletal: Patient has tenderness along lower lumbar spine , no paraspinal tenderness or swellings noted Skin: No rashes, lesions or ulcers Psychiatry: Judgement and insight appear normal. Mood & affect appropriate.     Data Reviewed: I have personally reviewed following labs and imaging studies  CBC: Recent Labs  Lab 06/04/19 1750 06/04/19 2014  WBC 17.0* 16.8*  NEUTROABS  --  14.6*  HGB 13.0 13.1  HCT 38.1 38.4  MCV 80.7  81.0  PLT 276 284   Basic Metabolic Panel: Recent Labs  Lab 06/04/19 1750 06/04/19 2202 06/05/19 0757  NA 134*  --  137  K 3.1*  --  3.1*  CL 99  --  104  CO2 25  --  27  GLUCOSE 107*  --  104*  BUN 7  --  <5*  CREATININE 0.60  --  0.51  CALCIUM 9.6  --  8.8*  MG  --  1.7 1.7   GFR: Estimated Creatinine Clearance: 118.2 mL/min (by C-G formula based on SCr of 0.51 mg/dL). Liver Function Tests: Recent Labs  Lab 06/04/19 1750 06/05/19 0757  AST 21 17  ALT 47* 34  ALKPHOS 57 39  BILITOT 0.3 0.6  PROT 7.4 5.4*  ALBUMIN 4.2 2.9*   Recent Labs  Lab 06/04/19 1750 06/04/19 2014  LIPASE 60*  --   AMYLASE  --  129*   Recent Labs  Lab 06/04/19 2114  AMMONIA 31   Coagulation Profile: No results for input(s): INR, PROTIME in the last 168 hours. Cardiac Enzymes: No results for input(s): CKTOTAL, CKMB, CKMBINDEX, TROPONINI in the last 168 hours. BNP (last 3 results) No results for input(s): PROBNP in the last 8760 hours. HbA1C: No results for input(s): HGBA1C in the last 72 hours. CBG: No results for input(s): GLUCAP in the last 168 hours. Lipid Profile: Recent Labs    06/04/19 2115 06/04/19 2202  TRIG 53 57  Thyroid Function Tests: Recent Labs    06/04/19 2329 06/05/19 0512  TSH 0.118*  --   FREET4  --  1.20*   Anemia Panel: No results for input(s): VITAMINB12, FOLATE, FERRITIN, TIBC, IRON, RETICCTPCT in the last 72 hours. Sepsis Labs: Recent Labs  Lab 06/04/19 2202  LATICACIDVEN 1.2    Recent Results (from the past 240 hour(s))  Wet prep, genital     Status: Abnormal   Collection Time: 06/04/19  6:51 PM   Specimen: Vaginal  Result Value Ref Range Status   Yeast Wet Prep HPF POC NONE SEEN NONE SEEN Final    Comment: Specimen diluted due to transport tube containing more than 1 ml of saline, interpret results with caution.   Trich, Wet Prep NONE SEEN NONE SEEN Final   Clue Cells Wet Prep HPF POC PRESENT (A) NONE SEEN Final   WBC, Wet Prep HPF  POC MANY (A) NONE SEEN Final   Sperm NONE SEEN  Final    Comment: Performed at [facility], [address] [place], [place] [address]  Culture, blood (routine x 2)     Status: None (Preliminary result)   Collection Time: 06/04/19 10:13 PM   Specimen: BLOOD RIGHT HAND  Result Value Ref Range Status   Specimen Description BLOOD RIGHT HAND  Final   Special Requests IN PEDIATRIC BOTTLE Blood Culture adequate volume  Final   Culture   Final    NO GROWTH < 24 HOURS Performed at [facility], [address] [place], [place] [address]    Report Status PENDING  Incomplete  Culture, blood (routine x 2)     Status: None (Preliminary result)   Collection Time: 06/04/19 10:18 PM   Specimen: BLOOD RIGHT ARM  Result Value Ref Range Status   Specimen Description BLOOD RIGHT ARM  Final   Special Requests IN PEDIATRIC BOTTLE Blood Culture adequate volume  Final   Culture   Final    NO GROWTH < 24 HOURS Performed at [facility], [address] [place], [place] [address]    Report Status PENDING  Incomplete  SARS Coronavirus 2 (CEPHEID - Performed in [facility] hospital lab), Hosp Order     Status: None   Collection Time: 06/04/19 10:40 PM   Specimen: Nasopharyngeal Swab  Result Value Ref Range Status   SARS Coronavirus 2 NEGATIVE NEGATIVE Final    Comment: (NOTE) If result is NEGATIVE SARS-CoV-2 target nucleic acids are NOT DETECTED. The SARS-CoV-2 RNA is generally detectable in upper and lower  respiratory specimens during the acute phase of infection. The lowest  concentration of SARS-CoV-2 viral copies this assay can detect is 250  copies / mL. A negative result does not preclude SARS-CoV-2 infection  and should not be used as the sole basis for treatment or other  patient management decisions.  A negative result may occur with  improper specimen collection / handling, submission of specimen other  than nasopharyngeal swab, presence of viral mutation(s) within the   areas targeted by this assay, and inadequate number of viral copies  (<250 copies / mL). A negative result must be combined with clinical  observations, patient history, and epidemiological information. If result is POSITIVE SARS-CoV-2 target nucleic acids are DETECTED. The SARS-CoV-2 RNA is generally detectable in upper and lower  respiratory specimens dur ing the acute phase of infection.  Positive  results are indicative of active infection with SARS-CoV-2.  Clinical  correlation with patient history and other  diagnostic information is  necessary to determine patient infection status.  Positive results do  not rule out bacterial infection or co-infection with other viruses. If result is PRESUMPTIVE POSTIVE SARS-CoV-2 nucleic acids MAY BE PRESENT.   A presumptive positive result was obtained on the submitted specimen  and confirmed on repeat testing.  While 2019 novel coronavirus  (SARS-CoV-2) nucleic acids may be present in the submitted sample  additional confirmatory testing may be necessary for epidemiological  and / or clinical management purposes  to differentiate between  SARS-CoV-2 and other Sarbecovirus currently known to infect humans.  If clinically indicated additional testing with an alternate test  methodology [phone] is advised. The SARS-CoV-2 RNA is generally  detectable in upper and lower respiratory sp ecimens during the acute  phase of infection. The expected result is Negative. Fact Sheet for Patients:  BoilerBrush.com.cyhttps://www.fda.gov/media/136312/download Fact Sheet for Healthcare Providers: https://pope.com/https://www.fda.gov/media/136313/download This test is not yet approved or cleared by the [place] FDA and has been authorized for detection and/or diagnosis of SARS-CoV-2 by FDA under an Emergency Use Authorization (EUA).  This EUA will remain in effect (meaning this test can be used) for the duration of the COVID-19 declaration under Section 564(b)(1) of the Act, 21  U.S.C. section 360bbb-3(b)(1), unless the authorization is terminated or revoked sooner. Performed at [facility], [address] [place], [place] [address]       Radiology Studies: Ct Head Wo Contrast  Result Date: 06/05/2019 CLINICAL DATA:  Somnolence.  Early pregnancy. EXAM: CT HEAD WITHOUT CONTRAST TECHNIQUE: Contiguous axial images were obtained from the base of the skull through the vertex without intravenous contrast. COMPARISON:  None. FINDINGS: Brain: The brain shows a normal appearance without evidence of malformation, atrophy, old or acute small or large vessel infarction, mass lesion, hemorrhage, hydrocephalus or extra-axial collection. Vascular: No hyperdense vessel. No evidence of atherosclerotic calcification. Skull: Normal.  No traumatic finding.  No focal bone lesion. Sinuses/Orbits: Sinuses are clear except for a retention cyst the right frontal sinus. Orbits appear normal. Mastoids are clear. Other: None significant IMPRESSION: Normal head CT Electronically Signed   By: Paulina FusiMark  Shogry M.D.   On: 06/05/2019 11:04   Mr Thoracic Spine Wo Contrast  Result Date: 06/05/2019 CLINICAL DATA:  Initial evaluation for acute severe thoracic back pain with tenderness over spinous processes. Evaluate for osteomyelitis or abscess. EXAM: MRI THORACIC SPINE WITHOUT CONTRAST TECHNIQUE: Multiplanar, multisequence MR imaging of the thoracic spine was performed. No intravenous contrast was administered. COMPARISON:  None. FINDINGS: Alignment: Mild levoscoliosis with straightening of the normal thoracic kyphosis. No listhesis or subluxation. Vertebrae: Vertebral body height maintained without evidence for acute or chronic fracture. Bone marrow signal intensity within normal limits. No abnormal marrow edema to suggest osteomyelitis. No imaging findings to suggest discitis. No discrete or worrisome osseous lesions. Cord: Signal intensity within the thoracic spinal cord is normal. Normal cord caliber  morphology. No epidural abscess or other collection identified. Paraspinal and other soft tissues: Paraspinous soft tissues demonstrate no acute finding. Partially visualized lungs are clear. Visualized visceral structures unremarkable. Disc levels: No significant disc pathology seen within the thoracic spine. Intervertebral discs are well hydrated with preserved disc height. No significant facet degeneration. No canal or neural foraminal stenosis. No neural impingement. IMPRESSION: 1. Mild levoscoliosis. 2. Otherwise unremarkable and normal MRI of the thoracic spine. No evidence for osteomyelitis discitis or other infection. No significant disc pathology, stenosis, or neural impingement. Electronically Signed   By: Janell QuietBenjamin  McClintock M.D.  On: 06/05/2019 01:05   [place] Abdomen Complete  Result Date: 06/04/2019 CLINICAL DATA:  Abdominal pain, nausea, and vomiting. EXAM: ABDOMEN ULTRASOUND COMPLETE COMPARISON:  None. FINDINGS: Examination was technically difficult due to motion artifact as well as patient's inability to take a deep breath. Gallbladder: No gallstones or wall thickening visualized. No sonographic Murphy sign noted by sonographer. Common bile duct: Diameter: 2 mm Liver: No focal lesion identified. Within normal limits in parenchymal echogenicity. Portal vein is patent on color Doppler imaging with normal direction of blood flow towards the liver. IVC: No abnormality visualized. Pancreas: Visualized portion unremarkable. Spleen: Size and appearance within normal limits. Right Kidney: Length: 12.1 cm. Echogenicity within normal limits. No mass or hydronephrosis visualized. Left Kidney: Length: 11.1 cm. Echogenicity within normal limits. No mass or hydronephrosis visualized. Abdominal aorta: No aneurysm visualized. Other findings: None. IMPRESSION: Negative abdominal ultrasound. Electronically Signed   By: Sebastian AcheAllen  Grady M.D.   On: 06/04/2019 20:22        Scheduled Meds:  docusate sodium  100 mg  Oral Daily   feeding supplement (ENSURE ENLIVE)  237 mL Oral BID BM   pantoprazole (PROTONIX) IV  40 mg Intravenous Q12H   potassium chloride  20 mEq Oral BID   prenatal multivitamin  1 tablet Oral Q1200   Continuous Infusions:  lactated ringers 125 mL/hr at 06/05/19 1413    Assessment & Plan:    1. Back pain : Patient today points to her lumbar spine when asked about pain and tender on palpation. She had c/o pain between the shoulder blades yesterday. MRI thoracic spine done yesterday was unremarkable. Tmax 99.23F yesterday at 8.30pm. Repeat CBC for follow up. Can try local analgesics like lidocaine patch (ok per OB) to see if it will help with her symptoms. Will consider MRI L-spine if leucocytosis persistent or develops fever.   2. Dyspepsia: Gastritis vs cyclical vomiting in the setting of marijuana use/pregnancy. Continue protonix for now. Added simethicone for gas as complaining of belching. Advance diet as tolerated  3. Leucocytosis: reactive in the setting of n/v vs inflammatory. U/A shows pyuria with rare bacteria. Urine cx pending. Blood cx -ve so far. Wet prep did show clue cells. Defer management to primary service. CBC in am  4.  Toxic encephalopathy: POA . Likely secondary to substance abuse. Marijuana +ve on UDS. AAOx3 now. Alc level/salicylate level not elevated. Free T4 slightly elevated with suppressed TSH -not sure if expected in pregnancy. CT head -ve on admission  5. H/O A fib: Isolated episode in March 2020 in the setting of n/v. Telemetry here unremarkable.   6. Hypokalemia: replace  7. Pregnancy: Defer to primary service   D/w patient. D/W OB Attending     LOS: 1 day    Time spent: 25 minutes    Alessandra BevelsNeelima Jameek Bruntz, MD [facility] Pager [phone]  If 7PM-7AM, please contact night-coverage [email or web address] Password [email or web address] 06/05/2019, 2:24 PM

## 2019-06-05 NOTE — Progress Notes (Signed)
FACULTY PRACTICE ANTEPARTUM PROGRESS NOTE  Natalie Henson is a 23 y.o. G2P1001 at 2626w3d who is admitted for nausea/vomiting, abdominal and back pain.  Estimated Date of Delivery: 12/29/19  Length of Stay:  1 Days. Admitted 06/04/2019  Subjective: Patient reports her abdominal pain is improved this am, she has not had any further nausea/vomiting overnight. Tolerating clears. She still has back pain, not improved. Denies cramping, contractions. Denies leaking or bleeding. During interview, patient fell asleep while speaking, when awoken, states her thoughts just "drifted off."  Vitals:  Blood pressure 136/80, pulse 88, temperature 98.2 F (36.8 C), temperature source Oral, resp. rate 18, height 5\' 7"  (1.702 m), weight 75.8 kg, last menstrual period 03/09/2019, SpO2 100 %. Physical Examination: CONSTITUTIONAL: Well-developed, well-nourished female in no acute distress. Somewhat more alert today but still somnolent and falls asleep in the middle of discussion HENT:  Normocephalic, atraumatic, External right and left ear normal. Oropharynx is clear and moist EYES: Conjunctivae and EOM are normal. Pupils are equal, round, and reactive to light. No scleral icterus.  NECK: Normal range of motion, supple, no masses. SKIN: Skin is warm and dry. No rash noted. Not diaphoretic. No erythema. No pallor. NEUROLGIC: Alert and oriented to person, place, and time. Normal reflexes, muscle tone coordination. No cranial nerve deficit noted. PSYCHIATRIC: Normal mood and affect. Normal behavior. Normal judgment and thought content. CARDIOVASCULAR: Normal heart rate noted RESPIRATORY: Effort normal, no problems with respiration noted MUSCULOSKELETAL: Normal range of motion. No edema and no tenderness. ABDOMEN: Soft, mildly tender, nondistended CERVIX: deferred   Results for orders placed or performed during the hospital encounter of 06/04/19 (from the past 48 hour(s))  Urinalysis, Routine w reflex microscopic      Status: Abnormal   Collection Time: 06/04/19  4:10 PM  Result Value Ref Range   Color, Urine AMBER (A) YELLOW    Comment: BIOCHEMICALS MAY BE AFFECTED BY COLOR   APPearance HAZY (A) CLEAR   Specific Gravity, Urine 1.031 (H) 1.005 - 1.030   pH 5.0 5.0 - 8.0   Glucose, UA NEGATIVE NEGATIVE mg/dL   Hgb urine dipstick NEGATIVE NEGATIVE   Bilirubin Urine NEGATIVE NEGATIVE   Ketones, ur 80 (A) NEGATIVE mg/dL   Protein, ur 782100 (A) NEGATIVE mg/dL   Nitrite NEGATIVE NEGATIVE   Leukocytes,Ua TRACE (A) NEGATIVE   RBC / HPF 0-5 0 - 5 RBC/hpf   WBC, UA 11-20 0 - 5 WBC/hpf   Bacteria, UA RARE (A) NONE SEEN   Squamous Epithelial / LPF 11-20 0 - 5   Mucus PRESENT     Comment: Performed at [facility], [address] [place], [place] [address]  Urine rapid drug screen (hosp performed)     Status: Abnormal   Collection Time: 06/04/19  5:02 PM  Result Value Ref Range   Opiates NONE DETECTED NONE DETECTED   Cocaine NONE DETECTED NONE DETECTED   Benzodiazepines NONE DETECTED NONE DETECTED   Amphetamines NONE DETECTED NONE DETECTED   Tetrahydrocannabinol POSITIVE (A) NONE DETECTED   Barbiturates NONE DETECTED NONE DETECTED    Comment: (NOTE) DRUG SCREEN FOR MEDICAL PURPOSES ONLY.  IF CONFIRMATION IS NEEDED FOR ANY PURPOSE, NOTIFY LAB WITHIN 5 DAYS. LOWEST DETECTABLE LIMITS FOR URINE DRUG SCREEN Drug Class                     Cutoff (ng/mL) Amphetamine and metabolites    1000 Barbiturate and metabolites    200 Benzodiazepine  200 Tricyclics and metabolites     300 Opiates and metabolites        300 Cocaine and metabolites        300 THC                            50 Performed at [facility], [address] [place], [place] [address]   CBC     Status: Abnormal   Collection Time: 06/04/19  5:50 PM  Result Value Ref Range   WBC 17.0 (H) 4.0 - 10.5 K/uL   RBC 4.72 3.87 - 5.11 MIL/uL   Hemoglobin 13.0 12.0 - 15.0 g/dL   HCT 19.138.1 47.836.0 - 29.546.0 %   MCV 80.7 80.0  - 100.0 fL   MCH 27.5 26.0 - 34.0 pg   MCHC 34.1 30.0 - 36.0 g/dL   RDW 62.114.0 30.811.5 - 65.715.5 %   Platelets 276 150 - 400 K/uL   nRBC 0.0 0.0 - 0.2 %    Comment: Performed at [facility], [address] [place], [place] [address]  Comprehensive metabolic panel     Status: Abnormal   Collection Time: 06/04/19  5:50 PM  Result Value Ref Range   Sodium 134 (L) 135 - 145 mmol/L   Potassium 3.1 (L) 3.5 - 5.1 mmol/L   Chloride 99 98 - 111 mmol/L   CO2 25 22 - 32 mmol/L   Glucose, Bld 107 (H) 70 - 99 mg/dL   BUN 7 6 - 20 mg/dL   Creatinine, Ser 2.950.60 0.44 - 1.00 mg/dL   Calcium 9.6 8.9 - 28.410.3 mg/dL   Total Protein 7.4 6.5 - 8.1 g/dL   Albumin 4.2 3.5 - 5.0 g/dL   AST 21 15 - 41 U/L   ALT 47 (H) 0 - 44 U/L   Alkaline Phosphatase 57 38 - 126 U/L   Total Bilirubin 0.3 0.3 - 1.2 mg/dL   GFR calc non Af Amer >60 >60 mL/min   GFR calc Af Amer >60 >60 mL/min   Anion gap 10 5 - 15    Comment: Performed at [facility], [address] [place], [place] [address]  Lipase, blood     Status: Abnormal   Collection Time: 06/04/19  5:50 PM  Result Value Ref Range   Lipase 60 (H) 11 - 51 U/L    Comment: Performed at [facility], [address] [place], [place] [address]  Ethanol     Status: None   Collection Time: 06/04/19  5:50 PM  Result Value Ref Range   Alcohol, Ethyl (B) <10 <10 mg/dL    Comment: (NOTE) Lowest detectable limit for serum alcohol is 10 mg/dL. For medical purposes only. Performed at [facility], [address] [place], [place] [address]   Wet prep, genital     Status: Abnormal   Collection Time: 06/04/19  6:51 PM   Specimen: Vaginal  Result Value Ref Range   Yeast Wet Prep HPF POC NONE SEEN NONE SEEN    Comment: Specimen diluted due to transport tube containing more than 1 ml of saline, interpret results with caution.   Trich, Wet Prep NONE SEEN NONE SEEN   Clue Cells Wet Prep HPF POC PRESENT (A) NONE SEEN   WBC, Wet Prep HPF POC MANY (A) NONE  SEEN   Sperm NONE SEEN     Comment: Performed at [facility], [address] [place], [place] [address]  Differential     Status: Abnormal   Collection Time: 06/04/19  8:14 PM  Result Value Ref Range   Neutrophils Relative % 87 %   Neutro Abs 14.6 (H) 1.7 - 7.7 K/uL   Lymphocytes Relative 9 %   Lymphs Abs 1.5 0.7 - 4.0 K/uL   Monocytes Relative 3 %   Monocytes Absolute 0.6 0.1 - 1.0 K/uL   Eosinophils Relative 0 %   Eosinophils Absolute 0.0 0.0 - 0.5 K/uL   Basophils Relative 0 %   Basophils Absolute 0.0 0.0 - 0.1 K/uL   Immature Granulocytes 1 %   Abs Immature Granulocytes 0.09 (H) 0.00 - 0.07 K/uL    Comment: Performed at [facility], [address] [place], [place] [address]  Amylase     Status: Abnormal   Collection Time: 06/04/19  8:14 PM  Result Value Ref Range   Amylase 129 (H) 28 - 100 U/L    Comment: Performed at [facility], [address] [place], [place] [address]  CBC     Status: Abnormal   Collection Time: 06/04/19  8:14 PM  Result Value Ref Range   WBC 16.8 (H) 4.0 - 10.5 K/uL   RBC 4.74 3.87 - 5.11 MIL/uL   Hemoglobin 13.1 12.0 - 15.0 g/dL   HCT 01.038.4 27.236.0 - 53.646.0 %   MCV 81.0 80.0 - 100.0 fL   MCH 27.6 26.0 - 34.0 pg   MCHC 34.1 30.0 - 36.0 g/dL   RDW 64.414.1 03.411.5 - 74.215.5 %   Platelets 271 150 - 400 K/uL   nRBC 0.0 0.0 - 0.2 %    Comment: Performed at [facility], [address] [place], [place] [address]  Ammonia     Status: None   Collection Time: 06/04/19  9:14 PM  Result Value Ref Range   Ammonia 31 9 - 35 umol/L    Comment: Performed at [facility], [address] [place], [place] [address]  Triglycerides     Status: None   Collection Time: 06/04/19  9:15 PM  Result Value Ref Range   Triglycerides 53 <150 mg/dL    Comment: Performed at [facility], [address] [place], [place] [address]  Triglycerides     Status: None   Collection Time: 06/04/19 10:02 PM  Result Value Ref Range   Triglycerides 57  <150 mg/dL    Comment: Performed at [facility], [address] [place], [place] [address]  Lactic acid, plasma     Status: None   Collection Time: 06/04/19 10:02 PM  Result Value Ref Range   Lactic Acid, Venous 1.2 0.5 - 1.9 mmol/L    Comment: Performed at [facility], [address] [place], [place] [address]  C-reactive protein     Status: None   Collection Time: 06/04/19 10:02 PM  Result Value Ref Range   CRP <0.8 <1.0 mg/dL    Comment: Performed at [facility], [address] [place], [place] [address]  Sedimentation rate     Status: None   Collection Time: 06/04/19 10:02 PM  Result Value Ref Range   Sed Rate 12 0 - 22 mm/hr    Comment: Performed at [facility], [address] [place], [place] [address]  Magnesium     Status: None   Collection Time: 06/04/19 10:02 PM  Result Value Ref Range  Magnesium 1.7 1.7 - 2.4 mg/dL    Comment: Performed at [facility], [place] [address], [place], [place] [address]  Blood gas, arterial     Status: Abnormal   Collection Time: 06/04/19 10:27 PM  Result Value Ref Range   pH, Arterial 7.482 (H) 7.350 - 7.450   pCO2 arterial 32.9 32.0 - 48.0 mmHg   pO2, Arterial 92.3 83.0 - 108.0 mmHg   Bicarbonate 24.3 20.0 - 28.0 mmol/L   Acid-Base Excess 1.6 0.0 - 2.0 mmol/L   O2 Saturation 98.4 %   Collection site RADIAL    Drawn by [id number]    Sample type ARTERIAL    Allens test (pass/fail) PASS PASS  SARS Coronavirus 2 (CEPHEID - Performed in [place] hospital lab), Hosp Order     Status: None   Collection Time: 06/04/19 10:40 PM   Specimen: Nasopharyngeal Swab  Result Value Ref Range   SARS Coronavirus 2 NEGATIVE NEGATIVE    Comment: (NOTE) If result is NEGATIVE SARS-CoV-2 target nucleic acids are NOT DETECTED. The SARS-CoV-2 RNA is generally detectable in upper and lower  respiratory specimens during the acute phase of infection. The lowest  concentration of SARS-CoV-2 viral copies this assay can detect  is 250  copies / mL. A negative result does not preclude SARS-CoV-2 infection  and should not be used as the sole basis for treatment or other  patient management decisions.  A negative result may occur with  improper specimen collection / handling, submission of specimen other  than nasopharyngeal swab, presence of viral mutation(s) within the  areas targeted by this assay, and inadequate number of viral copies  (<250 copies / mL). A negative result must be combined with clinical  observations, patient history, and epidemiological information. If result is POSITIVE SARS-CoV-2 target nucleic acids are DETECTED. The SARS-CoV-2 RNA is generally detectable in upper and lower  respiratory specimens dur ing the acute phase of infection.  Positive  results are indicative of active infection with SARS-CoV-2.  Clinical  correlation with patient history and other diagnostic information is  necessary to determine patient infection status.  Positive results do  not rule out bacterial infection or co-infection with other viruses. If result is PRESUMPTIVE POSTIVE SARS-CoV-2 nucleic acids MAY BE PRESENT.   A presumptive positive result was obtained on the submitted specimen  and confirmed on repeat testing.  While 2019 novel coronavirus  (SARS-CoV-2) nucleic acids may be present in the submitted sample  additional confirmatory testing may be necessary for epidemiological  and / or clinical management purposes  to differentiate between  SARS-CoV-2 and other Sarbecovirus currently known to infect humans.  If clinically indicated additional testing with an alternate test  methodology [phone]) is advised. The SARS-CoV-2 RNA is generally  detectable in upper and lower respiratory sp ecimens during the acute  phase of infection. The expected result is Negative. Fact Sheet for Patients:  StrictlyIdeas.no Fact Sheet for Healthcare  Providers: BankingDealers.co.za This test is not yet approved or cleared by the [place] FDA and has been authorized for detection and/or diagnosis of SARS-CoV-2 by FDA under an Emergency Use Authorization (EUA).  This EUA will remain in effect (meaning this test can be used) for the duration of the COVID-19 declaration under Section 564(b)(1) of the Act, 21 U.S.C. section 360bbb-3(b)(1), unless the authorization is terminated or revoked sooner. Performed at [facility], [place] [address] [place], [place] [address]   Type and screen [place]  Status: None   Collection Time: 06/04/19 11:29 PM  Result Value Ref Range   ABO/RH(D) B POS    Antibody Screen NEG    Sample Expiration      06/07/2019,2359 Performed at [facility], [address] [place], [place] [address]   Acetaminophen level     Status: Abnormal   Collection Time: 06/04/19 11:29 PM  Result Value Ref Range   Acetaminophen (Tylenol), Serum <10 (L) 10 - 30 ug/mL    Comment: (NOTE) Therapeutic concentrations vary significantly. A range of 10-30 ug/mL  may be an effective concentration for many patients. However, some  are best treated at concentrations outside of this range. Acetaminophen concentrations >150 ug/mL at 4 hours after ingestion  and >50 ug/mL at 12 hours after ingestion are often associated with  toxic reactions. Performed at [facility], [address] [place], [place] [address]   Salicylate level     Status: None   Collection Time: 06/04/19 11:29 PM  Result Value Ref Range   Salicylate Lvl <7.0 2.8 - 30.0 mg/dL    Comment: Performed at [facility], [address] [place], [place] [address]  TSH     Status: Abnormal   Collection Time: 06/04/19 11:29 PM  Result Value Ref Range   TSH 0.118 (L) 0.350 - 4.500 uIU/mL    Comment: Performed by a 3rd Generation assay with a functional sensitivity of <=0.01 uIU/mL. Performed at [facility], [address], [place], [place] [address]   ABO/Rh     Status: None   Collection Time: 06/04/19 11:29 PM  Result Value Ref Range   ABO/RH(D)      B POS Performed at [facility], [address] [place], [place] [address]   T4, free     Status: Abnormal   Collection Time: 06/05/19  5:12 AM  Result Value Ref Range   Free T4 1.20 (H) 0.61 - 1.12 ng/dL    Comment: (NOTE) Biotin ingestion may interfere with free T4 tests. If the results are inconsistent with the TSH level, previous test results, or the clinical presentation, then consider biotin interference. If needed, order repeat testing after stopping biotin. Performed at [facility], [address] [place], [place] [address]     I have reviewed the patient's current medications.  ASSESSMENT: Active Problems:   Nausea/vomiting in pregnancy   Abdominal pain   Back pain   Somnolence   PLAN:  Nausea/vomiting/abdominal pain - improved this am with fluids and pain meds - advance diet as tolerated  Back pain - neg MRI - no worsening overnight  Somnolence - not improved this am - head CT this am - f/u blood and urine cultures - labs thus far negative  Hypokalemia - replete PO - CMP this am  H/o A fib - has been on tele overnight, none noted   Appreciate hospitalist team care   Baldemar Lenis, M.D. Attending [facility] [employer] (Faculty Practice)  06/05/2019 8:18 AM

## 2019-06-06 DIAGNOSIS — G9341 Metabolic encephalopathy: Secondary | ICD-10-CM | POA: Diagnosis not present

## 2019-06-06 DIAGNOSIS — Z7189 Other specified counseling: Secondary | ICD-10-CM | POA: Diagnosis not present

## 2019-06-06 DIAGNOSIS — F1920 Other psychoactive substance dependence, uncomplicated: Secondary | ICD-10-CM

## 2019-06-06 DIAGNOSIS — R7989 Other specified abnormal findings of blood chemistry: Secondary | ICD-10-CM

## 2019-06-06 DIAGNOSIS — O219 Vomiting of pregnancy, unspecified: Secondary | ICD-10-CM | POA: Diagnosis not present

## 2019-06-06 DIAGNOSIS — M549 Dorsalgia, unspecified: Secondary | ICD-10-CM | POA: Diagnosis not present

## 2019-06-06 DIAGNOSIS — R1013 Epigastric pain: Secondary | ICD-10-CM | POA: Diagnosis not present

## 2019-06-06 LAB — CBC
HCT: 30.1 % — ABNORMAL LOW (ref 36.0–46.0)
Hemoglobin: 10 g/dL — ABNORMAL LOW (ref 12.0–15.0)
MCH: 27.6 pg (ref 26.0–34.0)
MCHC: 33.2 g/dL (ref 30.0–36.0)
MCV: 83.1 fL (ref 80.0–100.0)
Platelets: 219 10*3/uL (ref 150–400)
RBC: 3.62 MIL/uL — ABNORMAL LOW (ref 3.87–5.11)
RDW: 14.5 % (ref 11.5–15.5)
WBC: 11.3 10*3/uL — ABNORMAL HIGH (ref 4.0–10.5)
nRBC: 0 % (ref 0.0–0.2)

## 2019-06-06 LAB — BASIC METABOLIC PANEL
Anion gap: 6 (ref 5–15)
BUN: <5 mg/dL — ABNORMAL LOW (ref 6–20)
CO2: 26 mmol/L (ref 22–32)
Calcium: 8.6 mg/dL — ABNORMAL LOW (ref 8.9–10.3)
Chloride: 106 mmol/L (ref 98–111)
Creatinine, Ser: 0.52 mg/dL (ref 0.44–1.00)
GFR calc Af Amer: >60 mL/min (ref >60)
GFR calc non Af Amer: >60 mL/min (ref >60)
Glucose, Bld: 96 mg/dL (ref 70–99)
Potassium: 3.2 mmol/L — ABNORMAL LOW (ref 3.5–5.1)
Sodium: 138 mmol/L (ref 135–145)

## 2019-06-06 LAB — POTASSIUM: Potassium: 3.4 mmol/L — ABNORMAL LOW (ref 3.5–5.1)

## 2019-06-06 LAB — CULTURE, OB URINE: Culture: <10000 — AB

## 2019-06-06 MED ORDER — MAGNESIUM SULFATE 2 GM/50ML IV SOLN
2.0000 g | Freq: Once | INTRAVENOUS | Status: AC
  Administered 2019-06-06: 2 g via INTRAVENOUS
  Filled 2019-06-06: qty 50

## 2019-06-06 MED ORDER — POTASSIUM CHLORIDE 10 MEQ/100ML IV SOLN
10.0000 meq | INTRAVENOUS | Status: AC
  Administered 2019-06-06 (×2): 10 meq via INTRAVENOUS
  Filled 2019-06-06 (×2): qty 100

## 2019-06-06 MED ORDER — ZOLPIDEM TARTRATE 5 MG PO TABS
5.0000 mg | ORAL_TABLET | Freq: Every evening | ORAL | Status: DC | PRN
  Administered 2019-06-06 (×2): 5 mg via ORAL
  Filled 2019-06-06 (×2): qty 1

## 2019-06-06 MED ORDER — METRONIDAZOLE 500 MG PO TABS
500.0000 mg | ORAL_TABLET | Freq: Two times a day (BID) | ORAL | Status: DC
  Administered 2019-06-06 – 2019-06-07 (×3): 500 mg via ORAL
  Filled 2019-06-06 (×4): qty 1

## 2019-06-06 NOTE — Progress Notes (Signed)
Pt is refusing labs (CBC, CMP). States she wants lab to "come back when I'm awake". RN aware, asked lab to come back at 0800.  Nicholes Calamity, RN

## 2019-06-06 NOTE — Progress Notes (Signed)
Patient ID: Natalie Henson, female   DOB: Nov 30, 1995, 23 y.o.   MRN: [id number] FACULTY PRACTICE ANTEPARTUM PROGRESS NOTE  Avyn Aden is a 23 y.o. G2P1001 at [redacted]w[redacted]d who is admitted for nausea/vomiting, abdominal and back pain.  Estimated Date of Delivery: 12/29/19  Length of Stay:  2 Days. Admitted 06/04/2019  Subjective: Patient reports her abdominal pain is improved this am, she has not had any further nausea/vomiting yesterday and tolerated crackers. She did not order food from the hospital menu and family member did not bring food for her. Patient reports persistent back pain in thoracic region. She states no improvement with lidocaine patch and removed it. Denies cramping, contractions. Denies leaking or bleeding.   Vitals:  Blood pressure 117/73, pulse 89, temperature 98.2 F (36.8 C), temperature source Oral, resp. rate 17, height  (1.702 m), weight 75.8 kg, last menstrual period 03/09/2019, SpO2 100 %. Physical Examination: CONSTITUTIONAL: Well-developed, well-nourished female in no acute distress. Somewhat more alert today but still somnolent and falls asleep in the middle of discussion HENT:  Normocephalic, atraumatic, External right and left ear normal. Oropharynx is clear and moist EYES: Conjunctivae and EOM are normal. Pupils are equal, round, and reactive to light. No scleral icterus.  NECK: Normal range of motion, supple, no masses. SKIN: Skin is warm and dry. No rash noted. Not diaphoretic. No erythema. No pallor. NEUROLGIC: Alert and oriented to person, place, and time. Normal reflexes, muscle tone coordination. No cranial nerve deficit noted. PSYCHIATRIC: Normal mood and affect. Normal behavior. Normal judgment and thought content. CARDIOVASCULAR: Normal heart rate noted RESPIRATORY: Effort normal, no problems with respiration noted MUSCULOSKELETAL: Normal range of motion. No edema and no tenderness. ABDOMEN: Soft, mildly tender, nondistended CERVIX: deferred   Results  for orders placed or performed during the hospital encounter of 06/04/19 (from the past 48 hour(s))  Urinalysis, Routine w reflex microscopic     Status: Abnormal   Collection Time: 06/04/19  4:10 PM  Result Value Ref Range   Color, Urine AMBER (A) YELLOW    Comment: BIOCHEMICALS MAY BE AFFECTED BY COLOR   APPearance HAZY (A) CLEAR   Specific Gravity, Urine 1.031 (H) 1.005 - 1.030   pH 5.0 5.0 - 8.0   Glucose, UA NEGATIVE NEGATIVE mg/dL   Hgb urine dipstick NEGATIVE NEGATIVE   Bilirubin Urine NEGATIVE NEGATIVE   Ketones, ur 80 (A) NEGATIVE mg/dL   Protein, ur 478 (A) NEGATIVE mg/dL   Nitrite NEGATIVE NEGATIVE   Leukocytes,Ua TRACE (A) NEGATIVE   RBC / HPF 0-5 0 - 5 RBC/hpf   WBC, UA 11-20 0 - 5 WBC/hpf   Bacteria, UA RARE (A) NONE SEEN   Squamous Epithelial / LPF 11-20 0 - 5   Mucus PRESENT     Comment: Performed at [facility], [address] [place], [place] [address]  Urine rapid drug screen (hosp performed)     Status: Abnormal   Collection Time: 06/04/19  5:02 PM  Result Value Ref Range   Opiates NONE DETECTED NONE DETECTED   Cocaine NONE DETECTED NONE DETECTED   Benzodiazepines NONE DETECTED NONE DETECTED   Amphetamines NONE DETECTED NONE DETECTED   Tetrahydrocannabinol POSITIVE (A) NONE DETECTED   Barbiturates NONE DETECTED NONE DETECTED    Comment: (NOTE) DRUG SCREEN FOR MEDICAL PURPOSES ONLY.  IF CONFIRMATION IS NEEDED FOR ANY PURPOSE, NOTIFY LAB WITHIN 5 DAYS. LOWEST DETECTABLE LIMITS FOR URINE DRUG SCREEN Drug Class  Cutoff (ng/mL) Amphetamine and metabolites    1000 Barbiturate and metabolites    200 Benzodiazepine                 200 Tricyclics and metabolites     300 Opiates and metabolites        300 Cocaine and metabolites        300 THC                            50 Performed at [facility], [address] [place], [place] [address]   CBC     Status: Abnormal   Collection Time: 06/04/19  5:50 PM  Result Value Ref  Range   WBC 17.0 (H) 4.0 - 10.5 K/uL   RBC 4.72 3.87 - 5.11 MIL/uL   Hemoglobin 13.0 12.0 - 15.0 g/dL   HCT 60.438.1 54.036.0 - 98.146.0 %   MCV 80.7 80.0 - 100.0 fL   MCH 27.5 26.0 - 34.0 pg   MCHC 34.1 30.0 - 36.0 g/dL   RDW 19.114.0 47.811.5 - 29.515.5 %   Platelets 276 150 - 400 K/uL   nRBC 0.0 0.0 - 0.2 %    Comment: Performed at [facility], [address] [place], [place] [address]  Comprehensive metabolic panel     Status: Abnormal   Collection Time: 06/04/19  5:50 PM  Result Value Ref Range   Sodium 134 (L) 135 - 145 mmol/L   Potassium 3.1 (L) 3.5 - 5.1 mmol/L   Chloride 99 98 - 111 mmol/L   CO2 25 22 - 32 mmol/L   Glucose, Bld 107 (H) 70 - 99 mg/dL   BUN 7 6 - 20 mg/dL   Creatinine, Ser 8.650.60 0.44 - 1.00 mg/dL   Calcium 9.6 8.9 - 78.410.3 mg/dL   Total Protein 7.4 6.5 - 8.1 g/dL   Albumin 4.2 3.5 - 5.0 g/dL   AST 21 15 - 41 U/L   ALT 47 (H) 0 - 44 U/L   Alkaline Phosphatase 57 38 - 126 U/L   Total Bilirubin 0.3 0.3 - 1.2 mg/dL   GFR calc non Af Amer >60 >60 mL/min   GFR calc Af Amer >60 >60 mL/min   Anion gap 10 5 - 15    Comment: Performed at [facility], [address] [place], [place] [address]  Lipase, blood     Status: Abnormal   Collection Time: 06/04/19  5:50 PM  Result Value Ref Range   Lipase 60 (H) 11 - 51 U/L    Comment: Performed at [facility], [address] [place], [place] [address]  Ethanol     Status: None   Collection Time: 06/04/19  5:50 PM  Result Value Ref Range   Alcohol, Ethyl (B) <10 <10 mg/dL    Comment: (NOTE) Lowest detectable limit for serum alcohol is 10 mg/dL. For medical purposes only. Performed at [facility], [address] [place], [place] [address]   Wet prep, genital     Status: Abnormal   Collection Time: 06/04/19  6:51 PM   Specimen: Vaginal  Result Value Ref Range   Yeast Wet Prep HPF POC NONE SEEN NONE SEEN    Comment: Specimen diluted due to transport tube containing more than 1 ml of saline, interpret results  with caution.   Trich, Wet Prep NONE SEEN NONE SEEN   Clue Cells Wet Prep HPF  POC PRESENT (A) NONE SEEN   WBC, Wet Prep HPF POC MANY (A) NONE SEEN   Sperm NONE SEEN     Comment: Performed at [facility], [address] [place], [place] [address]  Differential     Status: Abnormal   Collection Time: 06/04/19  8:14 PM  Result Value Ref Range   Neutrophils Relative % 87 %   Neutro Abs 14.6 (H) 1.7 - 7.7 K/uL   Lymphocytes Relative 9 %   Lymphs Abs 1.5 0.7 - 4.0 K/uL   Monocytes Relative 3 %   Monocytes Absolute 0.6 0.1 - 1.0 K/uL   Eosinophils Relative 0 %   Eosinophils Absolute 0.0 0.0 - 0.5 K/uL   Basophils Relative 0 %   Basophils Absolute 0.0 0.0 - 0.1 K/uL   Immature Granulocytes 1 %   Abs Immature Granulocytes 0.09 (H) 0.00 - 0.07 K/uL    Comment: Performed at [facility], [address] [place], [place] [address]  Amylase     Status: Abnormal   Collection Time: 06/04/19  8:14 PM  Result Value Ref Range   Amylase 129 (H) 28 - 100 U/L    Comment: Performed at [facility], [address] [place], [place] [address]  CBC     Status: Abnormal   Collection Time: 06/04/19  8:14 PM  Result Value Ref Range   WBC 16.8 (H) 4.0 - 10.5 K/uL   RBC 4.74 3.87 - 5.11 MIL/uL   Hemoglobin 13.1 12.0 - 15.0 g/dL   HCT 13.038.4 86.536.0 - 78.446.0 %   MCV 81.0 80.0 - 100.0 fL   MCH 27.6 26.0 - 34.0 pg   MCHC 34.1 30.0 - 36.0 g/dL   RDW 69.614.1 29.511.5 - 28.415.5 %   Platelets 271 150 - 400 K/uL   nRBC 0.0 0.0 - 0.2 %    Comment: Performed at [facility], [address] [place], [place] [address]  Ammonia     Status: None   Collection Time: 06/04/19  9:14 PM  Result Value Ref Range   Ammonia 31 9 - 35 umol/L    Comment: Performed at [facility], [address] [place], [place] [address]  Triglycerides     Status: None   Collection Time: 06/04/19  9:15 PM  Result Value Ref Range   Triglycerides 53 <150 mg/dL    Comment: Performed at [facility], [address] [place], [place] [address]  Triglycerides     Status: None   Collection Time: 06/04/19 10:02 PM  Result Value Ref Range   Triglycerides 57 <150 mg/dL    Comment: Performed at [facility], [address] [place], [place] [address]  Lactic acid, plasma     Status: None   Collection Time: 06/04/19 10:02 PM  Result Value Ref Range   Lactic Acid, Venous 1.2 0.5 - 1.9 mmol/L    Comment: Performed at [facility], [address] [place], [place] [address]  C-reactive protein     Status: None   Collection Time: 06/04/19 10:02 PM  Result Value Ref Range   CRP <0.8 <1.0 mg/dL    Comment: Performed at [facility], [address] [place], [place] [address]  Sedimentation rate     Status: None   Collection Time: 06/04/19 10:02 PM  Result Value Ref Range   Sed Rate 12 0 - 22 mm/hr    Comment: Performed at  [facility], [address] [place], [place] [address]  Magnesium     Status: None   Collection Time: 06/04/19 10:02 PM  Result Value Ref Range   Magnesium 1.7 1.7 - 2.4 mg/dL    Comment: Performed at [facility], [address] [place], [place] [address]  Culture, blood (routine x 2)     Status: None (Preliminary result)   Collection Time: 06/04/19 10:13 PM   Specimen: BLOOD RIGHT HAND  Result Value Ref Range   Specimen Description BLOOD RIGHT HAND    Special Requests IN PEDIATRIC BOTTLE Blood Culture adequate volume    Culture      NO GROWTH < 24 HOURS Performed at [facility], [address] [place], [place] [address]    Report Status PENDING   Culture, blood (routine x 2)     Status: None (Preliminary result)   Collection Time: 06/04/19 10:18 PM   Specimen: BLOOD RIGHT ARM  Result Value Ref Range   Specimen Description BLOOD RIGHT ARM    Special Requests IN PEDIATRIC BOTTLE Blood Culture adequate volume    Culture      NO GROWTH < 24 HOURS Performed at [facility], [address] [place], [place] [address]    Report Status  PENDING   Blood gas, arterial     Status: Abnormal   Collection Time: 06/04/19 10:27 PM  Result Value Ref Range   pH, Arterial 7.482 (H) 7.350 - 7.450   pCO2 arterial 32.9 32.0 - 48.0 mmHg   pO2, Arterial 92.3 83.0 - 108.0 mmHg   Bicarbonate 24.3 20.0 - 28.0 mmol/L   Acid-Base Excess 1.6 0.0 - 2.0 mmol/L   O2 Saturation 98.4 %   Collection site RADIAL    Drawn by [phone]    Sample type ARTERIAL    Allens test (pass/fail) PASS PASS  SARS Coronavirus 2 (CEPHEID - Performed in [facility] lab), Hosp Order     Status: None   Collection Time: 06/04/19 10:40 PM   Specimen: Nasopharyngeal Swab  Result Value Ref Range   SARS Coronavirus 2 NEGATIVE NEGATIVE    Comment: (NOTE) If result is NEGATIVE SARS-CoV-2 target nucleic acids are NOT DETECTED. The SARS-CoV-2 RNA is generally detectable in upper and lower  respiratory specimens during the acute phase of infection. The lowest  concentration of SARS-CoV-2 viral copies this assay can detect is 250  copies / mL. A negative result does not preclude SARS-CoV-2 infection  and should not be used as the sole basis for treatment or other  patient management decisions.  A negative result may occur with  improper specimen collection / handling, submission of specimen other  than nasopharyngeal swab, presence of viral mutation(s) within the  areas targeted by this assay, and inadequate number of viral copies  (<250 copies / mL). A negative result must be combined with clinical  observations, patient history, and epidemiological information. If result is POSITIVE SARS-CoV-2 target nucleic acids are DETECTED. The SARS-CoV-2 RNA is generally detectable in upper and lower  respiratory specimens dur ing the acute phase of infection.  Positive  results are indicative of active infection with SARS-CoV-2.  Clinical  correlation with patient history and other diagnostic information is  necessary to determine patient infection status.  Positive  results do  not rule out bacterial infection or co-infection with other viruses. If result is PRESUMPTIVE POSTIVE SARS-CoV-2 nucleic acids MAY BE PRESENT.   A presumptive positive result was  obtained on the submitted specimen  and confirmed on repeat testing.  While 2019 novel coronavirus  (SARS-CoV-2) nucleic acids may be present in the submitted sample  additional confirmatory testing may be necessary for epidemiological  and / or clinical management purposes  to differentiate between  SARS-CoV-2 and other Sarbecovirus currently known to infect humans.  If clinically indicated additional testing with an alternate test  methodology [phone]) is advised. The SARS-CoV-2 RNA is generally  detectable in upper and lower respiratory sp ecimens during the acute  phase of infection. The expected result is Negative. Fact Sheet for Patients:  StrictlyIdeas.no Fact Sheet for Healthcare Providers: BankingDealers.co.za This test is not yet approved or cleared by the [place] FDA and has been authorized for detection and/or diagnosis of SARS-CoV-2 by FDA under an Emergency Use Authorization (EUA).  This EUA will remain in effect (meaning this test can be used) for the duration of the COVID-19 declaration under Section 564(b)(1) of the Act, 21 U.S.C. section 360bbb-3(b)(1), unless the authorization is terminated or revoked sooner. Performed at [facility], [place] [address] [place], [place] [address]   Type and screen [place]     Status: None   Collection Time: 06/04/19 11:29 PM  Result Value Ref Range   ABO/RH(D) B POS    Antibody Screen NEG    Sample Expiration      06/07/2019,2359 Performed at [facility], [place] [address], [place], [place] [address]   Acetaminophen level     Status: Abnormal   Collection Time: 06/04/19 11:29 PM  Result Value Ref Range   Acetaminophen (Tylenol), Serum <10 (L) 10 - 30 ug/mL     Comment: (NOTE) Therapeutic concentrations vary significantly. A range of 10-30 ug/mL  may be an effective concentration for many patients. However, some  are best treated at concentrations outside of this range. Acetaminophen concentrations >150 ug/mL at 4 hours after ingestion  and >50 ug/mL at 12 hours after ingestion are often associated with  toxic reactions. Performed at [facility], [place] [address] [place], [place] [address]   Salicylate level     Status: None   Collection Time: 06/04/19 11:29 PM  Result Value Ref Range   Salicylate Lvl <2.7 2.8 - 30.0 mg/dL    Comment: Performed at [place] [address] [place], [place] [address]  TSH     Status: Abnormal   Collection Time: 06/04/19 11:29 PM  Result Value Ref Range   TSH 0.118 (L) 0.350 - 4.500 uIU/mL    Comment: Performed by a 3rd Generation assay with a functional sensitivity of <=0.01 uIU/mL. Performed at [facility], [place] [address] [place], [place] [address]   ABO/Rh     Status: None   Collection Time: 06/04/19 11:29 PM  Result Value Ref Range   ABO/RH(D) B POS    No rh immune globuloin      NOT A RH IMMUNE GLOBULIN CANDIDATE, PT RH POSITIVE Performed at [place] [address], [place],  [address]   T4, free     Status: Abnormal   Collection Time: 06/05/19  5:12 AM  Result Value Ref Range   Free T4 1.20 (H) 0.61 - 1.12 ng/dL    Comment: (NOTE) Biotin ingestion may interfere with free T4 tests. If the results are inconsistent with the TSH level, previous test results, or the clinical presentation, then consider biotin interference. If needed, order repeat testing after  stopping biotin. Performed at [facility], [address] [place], [place] [address]   Comprehensive metabolic panel     Status: Abnormal   Collection Time: 06/05/19  7:57 AM  Result Value Ref Range   Sodium 137 135 - 145 mmol/L   Potassium 3.1 (L) 3.5 - 5.1 mmol/L   Chloride 104 98 -  111 mmol/L   CO2 27 22 - 32 mmol/L   Glucose, Bld 104 (H) 70 - 99 mg/dL   BUN <5 (L) 6 - 20 mg/dL   Creatinine, Ser 1.91 0.44 - 1.00 mg/dL   Calcium 8.8 (L) 8.9 - 10.3 mg/dL   Total Protein 5.4 (L) 6.5 - 8.1 g/dL   Albumin 2.9 (L) 3.5 - 5.0 g/dL   AST 17 15 - 41 U/L   ALT 34 0 - 44 U/L   Alkaline Phosphatase 39 38 - 126 U/L   Total Bilirubin 0.6 0.3 - 1.2 mg/dL   GFR calc non Af Amer >60 >60 mL/min   GFR calc Af Amer >60 >60 mL/min   Anion gap 6 5 - 15    Comment: Performed at [facility], [address], [place], [place] [address]  Magnesium     Status: None   Collection Time: 06/05/19  7:57 AM  Result Value Ref Range   Magnesium 1.7 1.7 - 2.4 mg/dL    Comment: Performed at [facility], [address], [place], [place] [address]    I have reviewed the patient's current medications.  ASSESSMENT: Active Problems:   Nausea/vomiting in pregnancy   Abdominal pain   Back pain   Somnolence   PLAN:  Nausea/vomiting/abdominal pain - improved and tolerating solids  Back pain - neg MRI - no worsening overnight - Follow up am cbc  BV - Flagyl started  Hypokalemia - replete PO - follow up labs  H/o A fib - has been on tele overnight, none noted   Appreciate hospitalist team care   [facility] Attending Center for [facility] (Faculty Practice)  06/06/2019 7:45 AM

## 2019-06-06 NOTE — Progress Notes (Signed)
PROGRESS NOTE    Natalie Henson  ZOX:[id number]  DOB: 01/01/1996  DOA: 06/04/2019 PCP: Patient, No Pcp Per  Medical consult f/u Subjective:  Patient states she is tolerating diet and nausea/belching better. She reports persistent back pain and today points to mid back (on admission reported between shoulder blades and yesterday pointed to lumbar area)Has heat back bedside and reports helps to some extent. She apparently took out lidocaine patch yesterday as it wasn't helping. She continues to request oxycodone and even longer acting medication to help with pain.   Objective: Vitals:   06/05/19 1932 06/06/19 0457 06/06/19 0923 06/06/19 1209  BP: 130/69 117/73 (!) 142/99 135/64  Pulse: 100 89 88 93  Resp: 18 17 18 18   Temp: 98.3 F (36.8 C) 98.2 F (36.8 C) 98 F (36.7 C) 97.8 F (36.6 C)  TempSrc: Oral Oral Oral Oral  SpO2: 100% 100% 100% 100%  Weight:      Height:        Intake/Output Summary (Last 24 hours) at 06/06/2019 1342 Last data filed at 06/05/2019 1700 Gross per 24 hour  Intake 1365.71 ml  Output --  Net 1365.71 ml   Filed Weights   06/04/19 1613 06/04/19 2301  Weight: 75.8 kg 75.8 kg    Physical Examination:  General exam: Appears calm and comfortable  Respiratory system: Clear to auscultation. Respiratory effort normal. Cardiovascular system: S1 & S2 heard, RRR. No JVD, murmurs, rubs, gallops or clicks. No pedal edema. Gastrointestinal system: Abdomen is nondistended, soft and nontender. No organomegaly or masses felt. Normal bowel sounds heard. Central nervous system: Alert and oriented. No focal neurological deficits. Musculoskeletal: Patient has subjective pain/ tenderness along mid back/lower thoracic spine , no paraspinal tenderness or swellings noted Skin: No rashes, lesions or ulcers Psychiatry: Judgement and insight appear normal. Mood & affect appropriate.     Data Reviewed: I have personally reviewed following labs and imaging  studies  CBC: Recent Labs  Lab 06/04/19 1750 06/04/19 2014 06/06/19 0814  WBC 17.0* 16.8* 11.3*  NEUTROABS  --  14.6*  --   HGB 13.0 13.1 10.0*  HCT 38.1 38.4 30.1*  MCV 80.7 81.0 83.1  PLT 276 271 219   Basic Metabolic Panel: Recent Labs  Lab 06/04/19 1750 06/04/19 2202 06/05/19 0757 06/06/19 0814  NA 134*  --  137 138  K 3.1*  --  3.1* 3.2*  CL 99  --  104 106  CO2 25  --  27 26  GLUCOSE 107*  --  104* 96  BUN 7  --  <5* <5*  CREATININE 0.60  --  0.51 0.52  CALCIUM 9.6  --  8.8* 8.6*  MG  --  1.7 1.7  --    GFR: Estimated Creatinine Clearance: 118.2 mL/min (by C-G formula based on SCr of 0.52 mg/dL). Liver Function Tests: Recent Labs  Lab 06/04/19 1750 06/05/19 0757  AST 21 17  ALT 47* 34  ALKPHOS 57 39  BILITOT 0.3 0.6  PROT 7.4 5.4*  ALBUMIN 4.2 2.9*   Recent Labs  Lab 06/04/19 1750 06/04/19 2014  LIPASE 60*  --   AMYLASE  --  129*   Recent Labs  Lab 06/04/19 2114  AMMONIA 31   Coagulation Profile: No results for input(s): INR, PROTIME in the last 168 hours. Cardiac Enzymes: No results for input(s): CKTOTAL, CKMB, CKMBINDEX, TROPONINI in the last 168 hours. BNP (last 3 results) No results for input(s): PROBNP in the last 8760 hours. HbA1C: No results for  input(s): HGBA1C in the last 72 hours. CBG: No results for input(s): GLUCAP in the last 168 hours. Lipid Profile: Recent Labs    06/04/19 2115 06/04/19 2202  TRIG 53 57   Thyroid Function Tests: Recent Labs    06/04/19 2329 06/05/19 0512  TSH 0.118*  --   FREET4  --  1.20*   Anemia Panel: No results for input(s): VITAMINB12, FOLATE, FERRITIN, TIBC, IRON, RETICCTPCT in the last 72 hours. Sepsis Labs: Recent Labs  Lab 06/04/19 2202  LATICACIDVEN 1.2    Recent Results (from the past 240 hour(s))  Wet prep, genital     Status: Abnormal   Collection Time: 06/04/19  6:51 PM   Specimen: Vaginal  Result Value Ref Range Status   Yeast Wet Prep HPF POC NONE SEEN NONE SEEN  Final    Comment: Specimen diluted due to transport tube containing more than 1 ml of saline, interpret results with caution.   Trich, Wet Prep NONE SEEN NONE SEEN Final   Clue Cells Wet Prep HPF POC PRESENT (A) NONE SEEN Final   WBC, Wet Prep HPF POC MANY (A) NONE SEEN Final   Sperm NONE SEEN  Final    Comment: Performed at [facility], [place] [address], [place], [place] [address]  Culture, blood (routine x 2)     Status: None (Preliminary result)   Collection Time: 06/04/19 10:13 PM   Specimen: BLOOD RIGHT HAND  Result Value Ref Range Status   Specimen Description BLOOD RIGHT HAND  Final   Special Requests IN PEDIATRIC BOTTLE Blood Culture adequate volume  Final   Culture   Final    NO GROWTH 2 DAYS Performed at [facility], [place] [address], [place], [place] [address]    Report Status PENDING  Incomplete  Culture, blood (routine x 2)     Status: None (Preliminary result)   Collection Time: 06/04/19 10:18 PM   Specimen: BLOOD RIGHT ARM  Result Value Ref Range Status   Specimen Description BLOOD RIGHT ARM  Final   Special Requests IN PEDIATRIC BOTTLE Blood Culture adequate volume  Final   Culture   Final    NO GROWTH 2 DAYS Performed at [facility], [place] [address], [place], [place] [address]    Report Status PENDING  Incomplete  SARS Coronavirus 2 (CEPHEID - Performed in [place] hospital lab), Hosp Order     Status: None   Collection Time: 06/04/19 10:40 PM   Specimen: Nasopharyngeal Swab  Result Value Ref Range Status   SARS Coronavirus 2 NEGATIVE NEGATIVE Final    Comment: (NOTE) If result is NEGATIVE SARS-CoV-2 target nucleic acids are NOT DETECTED. The SARS-CoV-2 RNA is generally detectable in upper and lower  respiratory specimens during the acute phase of infection. The lowest  concentration of SARS-CoV-2 viral copies this assay can detect is 250  copies / mL. A negative result does not preclude SARS-CoV-2 infection  and should not be used as the  sole basis for treatment or other  patient management decisions.  A negative result may occur with  improper specimen collection / handling, submission of specimen other  than nasopharyngeal swab, presence of viral mutation(s) within the  areas targeted by this assay, and inadequate number of viral copies  (<250 copies / mL). A negative result must be combined with clinical  observations, patient history, and epidemiological information. If result is POSITIVE SARS-CoV-2 target nucleic acids are DETECTED. The SARS-CoV-2 RNA is generally detectable in  upper and lower  respiratory specimens dur ing the acute phase of infection.  Positive  results are indicative of active infection with SARS-CoV-2.  Clinical  correlation with patient history and other diagnostic information is  necessary to determine patient infection status.  Positive results do  not rule out bacterial infection or co-infection with other viruses. If result is PRESUMPTIVE POSTIVE SARS-CoV-2 nucleic acids MAY BE PRESENT.   A presumptive positive result was obtained on the submitted specimen  and confirmed on repeat testing.  While 2019 novel coronavirus  (SARS-CoV-2) nucleic acids may be present in the submitted sample  additional confirmatory testing may be necessary for epidemiological  and / or clinical management purposes  to differentiate between  SARS-CoV-2 and other Sarbecovirus currently known to infect humans.  If clinically indicated additional testing with an alternate test  methodology [phone] is advised. The SARS-CoV-2 RNA is generally  detectable in upper and lower respiratory sp ecimens during the acute  phase of infection. The expected result is Negative. Fact Sheet for Patients:  BoilerBrush.com.cyhttps://www.fda.gov/media/136312/download Fact Sheet for Healthcare Providers: https://pope.com/https://www.fda.gov/media/136313/download This test is not yet approved or cleared by the [place] FDA and has been authorized for detection  and/or diagnosis of SARS-CoV-2 by FDA under an Emergency Use Authorization (EUA).  This EUA will remain in effect (meaning this test can be used) for the duration of the COVID-19 declaration under Section 564(b)(1) of the Act, 21 U.S.C. section 360bbb-3(b)(1), unless the authorization is terminated or revoked sooner. Performed at [facility], [address] [place], [place] [address]   Culture, [place] Urine     Status: Abnormal   Collection Time: 06/05/19  2:35 AM   Specimen: Urine, Random  Result Value Ref Range Status   Specimen Description URINE, RANDOM  Final   Special Requests NONE  Final   Culture (A)  Final    <10,000 COLONIES/mL INSIGNIFICANT GROWTH NO GROUP B STREP (S.AGALACTIAE) ISOLATED Performed at [facility], [address] [place], [place] [address]    Report Status 06/06/2019 FINAL  Final      Radiology Studies: Ct Head Wo Contrast  Result Date: 06/05/2019 CLINICAL DATA:  Somnolence.  Early pregnancy. EXAM: CT HEAD WITHOUT CONTRAST TECHNIQUE: Contiguous axial images were obtained from the base of the skull through the vertex without intravenous contrast. COMPARISON:  None. FINDINGS: Brain: The brain shows a normal appearance without evidence of malformation, atrophy, old or acute small or large vessel infarction, mass lesion, hemorrhage, hydrocephalus or extra-axial collection. Vascular: No hyperdense vessel. No evidence of atherosclerotic calcification. Skull: Normal.  No traumatic finding.  No focal bone lesion. Sinuses/Orbits: Sinuses are clear except for a retention cyst the right frontal sinus. Orbits appear normal. Mastoids are clear. Other: None significant IMPRESSION: Normal head CT Electronically Signed   By: Paulina FusiMark  Shogry M.D.   On: 06/05/2019 11:04   Mr Thoracic Spine Wo Contrast  Result Date: 06/05/2019 CLINICAL DATA:  Initial evaluation for acute severe thoracic back pain with tenderness over spinous processes. Evaluate for osteomyelitis or  abscess. EXAM: MRI THORACIC SPINE WITHOUT CONTRAST TECHNIQUE: Multiplanar, multisequence MR imaging of the thoracic spine was performed. No intravenous contrast was administered. COMPARISON:  None. FINDINGS: Alignment: Mild levoscoliosis with straightening of the normal thoracic kyphosis. No listhesis or subluxation. Vertebrae: Vertebral body height maintained without evidence for acute or chronic fracture. Bone marrow signal intensity within normal limits. No abnormal marrow edema to suggest osteomyelitis. No imaging findings to suggest discitis. No discrete or worrisome  osseous lesions. Cord: Signal intensity within the thoracic spinal cord is normal. Normal cord caliber morphology. No epidural abscess or other collection identified. Paraspinal and other soft tissues: Paraspinous soft tissues demonstrate no acute finding. Partially visualized lungs are clear. Visualized visceral structures unremarkable. Disc levels: No significant disc pathology seen within the thoracic spine. Intervertebral discs are well hydrated with preserved disc height. No significant facet degeneration. No canal or neural foraminal stenosis. No neural impingement. IMPRESSION: 1. Mild levoscoliosis. 2. Otherwise unremarkable and normal MRI of the thoracic spine. No evidence for osteomyelitis discitis or other infection. No significant disc pathology, stenosis, or neural impingement. Electronically Signed   By: Rise MuBenjamin  McClintock M.D.   On: 06/05/2019 01:05   [place] Abdomen Complete  Result Date: 06/04/2019 CLINICAL DATA:  Abdominal pain, nausea, and vomiting. EXAM: ABDOMEN ULTRASOUND COMPLETE COMPARISON:  None. FINDINGS: Examination was technically difficult due to motion artifact as well as patient's inability to take a deep breath. Gallbladder: No gallstones or wall thickening visualized. No sonographic Murphy sign noted by sonographer. Common bile duct: Diameter: 2 mm Liver: No focal lesion identified. Within normal limits in  parenchymal echogenicity. Portal vein is patent on color Doppler imaging with normal direction of blood flow towards the liver. IVC: No abnormality visualized. Pancreas: Visualized portion unremarkable. Spleen: Size and appearance within normal limits. Right Kidney: Length: 12.1 cm. Echogenicity within normal limits. No mass or hydronephrosis visualized. Left Kidney: Length: 11.1 cm. Echogenicity within normal limits. No mass or hydronephrosis visualized. Abdominal aorta: No aneurysm visualized. Other findings: None. IMPRESSION: Negative abdominal ultrasound. Electronically Signed   By: Sebastian AcheAllen  Grady M.D.   On: 06/04/2019 20:22        Scheduled Meds:  docusate sodium  100 mg Oral Daily   feeding supplement (ENSURE ENLIVE)  237 mL Oral BID BM   lidocaine  1 patch Transdermal Q24H   metroNIDAZOLE  500 mg Oral Q12H   pantoprazole (PROTONIX) IV  40 mg Intravenous Q12H   potassium chloride  20 mEq Oral BID   prenatal multivitamin  1 tablet Oral Q1200   simethicone  80 mg Oral BID   Continuous Infusions:  lactated ringers 125 mL/hr at 06/06/19 0655    Assessment & Plan:    1. Back pain : Subjective pain out of proportion to clinical findings thus far. She had c/o pain between the shoulder blades on admission and has provided variable history during stay here. MRI thoracic spine done on admission was unremarkable. Afebrile and repeat CBC shows downtrending white count. I do not feel subjecting her to more imaging stadies/radiation exposure will be of high yield at this point. I explained this to patient and advised regarding limitation on prescribing opiates per [place] STOP act and also counseled regarding polysubstance abuse in pregnant state (alcohol/marijuana). No additional medical w/u needed.   2. Dyspepsia: Gastritis vs cyclical vomiting in the setting of marijuana use/pregnancy. Continue protonix for now. Added simethicone for gas as complaining of belching and seems to be helping, can  be discharged on the same. LFTs okay and lipase okay. No c/o abdominal pain.Tolerating reg diet.   3. Leucocytosis: reactive in the setting of n/v vs inflammatory. U/A shows pyuria with rare bacteria. Urine cx growing insignificant growth. Defer need for abx to primary service. Wet prep did show clue cells. Started on flagyl.Blood cx -ve so far.She also had infiltrated IV today and has some tenderness along right wrist but no swelling. Continue warm compresses for mild phlebitis.   4.  Toxic  encephalopathy: POA . Likely secondary to substance abuse. Marijuana +ve on UDS. AAOx3 now. Alc level/salicylate level not elevated. Free T4 slightly elevated with suppressed TSH -not sure if expected in pregnancy. CT head -ve on admission  5. H/O A fib: Isolated episode in March 2020 in the setting of n/v. Telemetry here unremarkable.   6. Hypokalemia: Refractory, probably wasn't tolerating po. Supplementing IV. Denies h/o chronic hypokalemia. Repeat level tonight or in am.   7. Pregnancy: Defer to primary service regarding management of pain/nausea/urine cx results. Consider d/c on PPI/simethicone and possibly potassium supplements for short term.   D/w patient and bedside nurse. Medicine will sign off. Please call if additional questions/concerns     LOS: 2 days    Time spent: 35 minutes    Alessandra Bevels, MD [facility] Pager [phone]  If 7PM-7AM, please contact night-coverage [email or web address] Password [email or web address] 06/06/2019, 1:42 PM

## 2019-06-07 DIAGNOSIS — O219 Vomiting of pregnancy, unspecified: Secondary | ICD-10-CM | POA: Diagnosis not present

## 2019-06-07 DIAGNOSIS — O210 Mild hyperemesis gravidarum: Principal | ICD-10-CM

## 2019-06-07 DIAGNOSIS — R1013 Epigastric pain: Secondary | ICD-10-CM | POA: Diagnosis not present

## 2019-06-07 DIAGNOSIS — Z3A10 10 weeks gestation of pregnancy: Secondary | ICD-10-CM | POA: Diagnosis not present

## 2019-06-07 LAB — BASIC METABOLIC PANEL
Anion gap: 5 (ref 5–15)
BUN: <5 mg/dL — ABNORMAL LOW (ref 6–20)
CO2: 27 mmol/L (ref 22–32)
Calcium: 8.4 mg/dL — ABNORMAL LOW (ref 8.9–10.3)
Chloride: 105 mmol/L (ref 98–111)
Creatinine, Ser: 0.52 mg/dL (ref 0.44–1.00)
GFR calc Af Amer: >60 mL/min (ref >60)
GFR calc non Af Amer: >60 mL/min (ref >60)
Glucose, Bld: 94 mg/dL (ref 70–99)
Potassium: 3.4 mmol/L — ABNORMAL LOW (ref 3.5–5.1)
Sodium: 137 mmol/L (ref 135–145)

## 2019-06-07 LAB — T3: T3, Total: 141 ng/dL (ref 71–180)

## 2019-06-07 MED ORDER — IBUPROFEN 600 MG PO TABS: 600.0000 mg | ORAL_TABLET | Freq: Four times a day (QID) | ORAL | 0 refills | Status: AC | PRN

## 2019-06-07 MED ORDER — PROMETHAZINE HCL 25 MG PO TABS: 25.0000 mg | ORAL_TABLET | Freq: Four times a day (QID) | ORAL | 2 refills | Status: AC | PRN

## 2019-06-07 MED ORDER — METRONIDAZOLE 500 MG PO TABS: 500.0000 mg | ORAL_TABLET | Freq: Two times a day (BID) | ORAL | 0 refills | Status: AC

## 2019-06-07 NOTE — Discharge Summary (Signed)
Physician Discharge Summary  Patient ID: Natalie Henson MRN: [id number] DOB/AGE: 22/11/1995 22 y.o.  Admit date: 06/04/2019 Discharge date: 06/07/2019  Admission Diagnoses:  Discharge Diagnoses:  Active Problems:   Nausea/vomiting in pregnancy   Abdominal pain   Back pain   Somnolence   Hyperemesis gravidarum   Discharged Condition: good  Hospital Course: Natalie Natalie Henson is a [age] y.o. G2P1001 with IUP at 5466w2d presenting for nausea, vomiting, abdominal pain and back pain for about a week. Was seen in another ED and given diclegis but this has not helped. States she has been very nauseous and has been unable to keep down food, although on further questioning, she reports she had dinner last night. Was able to keep down dinner last night but reports pain in stomach has been increasing, rated it 10/10 tonight. Nothing improves her pain.  Also reports upper back pain. Took 1000 mg tylenol today for back pain but it did not help. Reports she has never had back pain like this. Denies trauma/fall. Denies IVDA. Does smoke marijuana, last was about 3 days ago to help with nausea. H/o heavy alcohol use, reports last time she had alcohol was when she was in the hospital for alcohol poisoning, which she thinks was earlier this year but unable to give good history. Told staff earlier she has not had alcohol in 2 months.  Denies vaginal bleeding or cramping, has not had any care in pregnancy thus far. [facility] of E[facility] with singleton IUP at 3542w2d on 05/28/19.  Prenatal Course  Source of Care: none  Pregnancy complications or risks:There are no active problems to display for this patient.  Medical History:      Past Medical History:  Diagnosis Date  . Alcohol abuse   . Medical history non-contributory         Past Surgical History:  Procedure Laterality Date  . NO PAST SURGERIES                     OB History  Gravida Para Term Preterm AB Living  2 1 1   1   SAB TAB Ectopic Multiple  Live Births         1       # Outcome Date GA Lbr Len/2nd Weight Sex Delivery Anes PTL Lv  2 Current           1 Term 04/14/17 4868w0d  3705 g F Vag-Spont EPI N LIV  Complications: Delayed postpartum hemorrhage   Social History        Socioeconomic History  . Marital status: Single    Spouse name: Not on file  . Number of children: Not on file  . Years of education: Not on file  . Highest education level: Not on file  Occupational History  . Not on file  Social Needs  . Financial resource strain: Not on file  . Food insecurity    Worry: Not on file    Inability: Not on file  . Transportation needs    Medical: Not on file    Non-medical: Not on file  Tobacco Use  . Smoking status: Never Smoker  Substance and Sexual Activity  . Alcohol use: Never    Frequency: Never  . Drug use: Yes    Types: Marijuana    Comment: used 2 days ago  . Sexual activity: Not on file  Lifestyle  . Physical activity    Days per week: Not on file  Minutes per session: Not on file  . Stress: Not on file  Relationships  . Social Musicianconnections    Talks on phone: Not on file    Gets together: Not on file    Attends religious service: Not on file    Active member of club or organization: Not on file    Attends meetings of clubs or organizations: Not on file    Relationship status: Not on file  Other Topics Concern  . Not on file  Social History Narrative  . Not on file  No family history on file.  No medications prior to admission.  No Known Allergies  Review of Systems: Negative except for what is mentioned in HPI.  Physical Exam:  BP 127/63  Pulse 93  Temp 99.2 F (37.3 C)  Resp 18  Wt 75.8 kg  LMP 03/09/2019  SpO2 100%  CONSTITUTIONAL: Well-developed, well-nourished female in mild distress with movements, extremely somnolent on arrival, somewhat difficult to awaken but once awake, she is more interactive  HENT: Normocephalic, atraumatic, External right and left ear normal.  Oropharynx is clear and moist  EYES: Conjunctivae and EOM are normal. Pupils are equal, round, and reactive to light. Eyes appeared to be rolling back in head at one point while speaking with patient but she was able to focus and respond  NECK: Normal range of motion, supple, no masses  SKIN: Skin is warm and dry. No rash noted. Not diaphoretic. No erythema. No pallor.  NEUROLOGIC: Alert and oriented to person, place, and time. Normal reflexes, muscle tone coordination. No cranial nerve deficit noted.  PSYCHIATRIC: Normal mood and affect. Normal judgment and thought content.  CARDIOVASCULAR: Normal heart rate noted  RESPIRATORY: Effort normal, no problems with respiration noted  ABDOMEN: Soft but tender in RUQ and epigastric area, nondistended. Severe back tenderness approx 10 cm long along spine in mid-back, no paraspinal tenderness, no CVA tenderness, no visible abscess  MUSCULOSKELETAL: Normal range of motion. No edema and no tenderness. 2+ distal pulses.  Pertinent Labs/Studies:  Lab Results Last 24 Hours  Assessment :  Natalie Natalie Henson is a 23 y.o. G2P1001 at 8671w2d being admitted for overnight observation for nausea/vomiting, abdominal and back pain. VSS but amylase/lipase/ALT mildly elevated and  patient extremely somnolent. UDS and serum ethanol negative except for THC, patient denies other drug use. Concern for hyperemesis vs pancreatitis although neither would explain her somnolence, which reportedly was present prior to the phenergan she received. [facility], pain management and workup. Have consulted hospitalist service, will await further lab work and recommendations.  Plan:  IVFs  Regular diet  Pain control as needed  hospitalist consult  SARS swab   Consults: Hospitalist service HPI: Natalie Natalie Henson is a 23 y.o. female, G2P1001 @ 10+2 by recent u/s, with medical history significant for alcohol abuse, who presents with above.  Patient complains of episodic nausea and vomiting and epigastric pain. Has had 3 ED visits this past year (march, April, and July) for this complaint. For a little over a week the patient has had nausea and vomiting that progressed to epigastric pain. Vomit is food/liquid, no bile, once or twice saw small flecks of blood. No hx abd surgery and says is passing stool and flatus. No dysuria. Feels warm at times but hasn't checked temperature. No covid exposures, no cough or shortness of breath. Uses marijuana. Admitted elsewhere for "alcohol poisoning" earlier this year. Says has not had alcohol for several months. Denies other drug use, denies history IV drug use. No history recent trauma or spine trauma. Reports that what's most "new" about this episode of nausea/vomiting/abd pain is that she has had several days of severe back pain in area of spine between shoulder blades. Hurts all the time, worse with coughing, vomiting, or movement. Denies hx bloodstream infections, endocarditis, or the like. Denies history pancreatitis. Denies ha or neck stiffness. Also says has a history of some sort of irregular heart beat. Says occasionally feels palpitations. None for several days.   ED Course: labs, imaging  Review of Systems: As per HPI otherwise 10  point review of systems negative.        Past Medical History:  Diagnosis Date  . Alcohol abuse   . Medical history non-contributory          Past Surgical History:  Procedure Laterality Date  . NO PAST SURGERIES       reports that she has never smoked. She does not have any smokeless tobacco history on file. She reports current drug use. Drug: Marijuana. She reports that she does not drink alcohol.  No Known Allergies  No family history on file.  Prior to Admission medications   Not on File    Physical Exam:       Vitals:   06/04/19 1458 06/04/19 1544 06/04/19 1613 06/04/19 2035  BP: 119/80 125/77  127/63  Pulse: 86 87  93  Resp: 16 18    Temp: 98 F (36.7 C) 97.9 F (36.6 C)  99.2 F (37.3 C)  TempSrc: Oral     SpO2: 100% 100%    Weight:   75.8 kg     Constitutional: in mild distress. Initially somnolent and somewhat slurring words, more alert and quick to respond after a few minutes of questioning Head: Atraumatic Eyes: Conjunctiva clear ENM: Moist mucous membranes. Normal dentition.  Neck: Supple, able to touch chin to chest Respiratory: Clear to auscultation bilaterally, no wheezing/rales/rhonchi. Normal respiratory effort. No accessory muscle use. . Cardiovascular: Regular rate and rhythm. Soft systolic murmur Abdomen: soft, ttp in  epigastrum, no ruq tenderness, no rebound or guarding, no suprapubic tenderness Musculoskeletal: No joint deformity upper and lower extremities. Normal ROM, no contractures. Normal muscle tone. Severe ttp over midline thoracic Skin: No rashes, lesions, or ulcers.  Extremities: No peripheral edema. Palpable peripheral pulses. Neurologic: somnolent then more alert, moving all 4 extremities. Psychiatric: see above   Labs on Admission: I have personally reviewed following labs and imaging studies  CBC: Last Labs       Recent Labs  Lab 06/04/19 1750 06/04/19 2014  WBC 17.0* 16.8*  NEUTROABS   --  14.6*  HGB 13.0 13.1  HCT 38.1 38.4  MCV 80.7 81.0  PLT 276 271     Basic Metabolic Panel: Last Labs      Recent Labs  Lab 06/04/19 1750  NA 134*  K 3.1*  CL 99  CO2 25  GLUCOSE 107*  BUN 7  CREATININE 0.60  CALCIUM 9.6     GFR: CrCl cannot be calculated (Unknown ideal weight.). Liver Function Tests: Last Labs      Recent Labs  Lab 06/04/19 1750  AST 21  ALT 47*  ALKPHOS 57  BILITOT 0.3  PROT 7.4  ALBUMIN 4.2     Last Labs       Recent Labs  Lab 06/04/19 1750 06/04/19 2014  LIPASE 60*  --   AMYLASE  --  129*     Last Labs      Recent Labs  Lab 06/04/19 2114  AMMONIA 31     Coagulation Profile: Last Labs   No results for input(s): INR, PROTIME in the last 168 hours.   Cardiac Enzymes: Last Labs   No results for input(s): CKTOTAL, CKMB, CKMBINDEX, TROPONINI in the last 168 hours.   BNP (last 3 results) Recent Labs (within last 365 days)  No results for input(s): PROBNP in the last 8760 hours.   HbA1C: Recent Labs (last 2 labs)   No results for input(s): HGBA1C in the last 72 hours.   CBG: Last Labs   No results for input(s): GLUCAP in the last 168 hours.   Lipid Profile: Recent Labs (last 2 labs)   No results for input(s): CHOL, HDL, LDLCALC, TRIG, CHOLHDL, LDLDIRECT in the last 72 hours.   Thyroid Function Tests: Recent Labs (last 2 labs)   No results for input(s): TSH, T4TOTAL, FREET4, T3FREE, THYROIDAB in the last 72 hours.   Anemia Panel: Recent Labs (last 2 labs)   No results for input(s): VITAMINB12, FOLATE, FERRITIN, TIBC, IRON, RETICCTPCT in the last 72 hours.   Urine analysis: Labs (Brief)          Component Value Date/Time   COLORURINE AMBER (A) 06/04/2019 1610   APPEARANCEUR HAZY (A) 06/04/2019 1610   LABSPEC 1.031 (H) 06/04/2019 1610   PHURINE 5.0 06/04/2019 1610   GLUCOSEU NEGATIVE 06/04/2019 1610   HGBUR NEGATIVE 06/04/2019 1610   BILIRUBINUR NEGATIVE 06/04/2019 1610   KETONESUR 80 (A)  06/04/2019 1610   PROTEINUR 100 (A) 06/04/2019 1610   NITRITE NEGATIVE 06/04/2019 1610   LEUKOCYTESUR TRACE (A) 06/04/2019 1610      Radiological Exams on Admission:  Imaging Results (Last 48 hours)  US Abdomen Complete  Result Date: 06/04/2019 CLINICAL DATA:  Abdominal pain, nausea, and vomiting. EXAM: ABDOMEN ULTRASOUND COMPLETE COMPARISON:  None. FINDINGS: Examination was technically difficult due to motion artifact as well as patient's inability to take a deep breath. Gallbladder: No gallstones or wall thickening visualized. No sonographic Murphy sign noted by sonographer.  Common bile duct: Diameter: 2 mm Liver: No focal lesion identified. Within normal limits in parenchymal echogenicity. Portal vein is patent on color Doppler imaging with normal direction of blood flow towards the liver. IVC: No abnormality visualized. Pancreas: Visualized portion unremarkable. Spleen: Size and appearance within normal limits. Right Kidney: Length: 12.1 cm. Echogenicity within normal limits. No mass or hydronephrosis visualized. Left Kidney: Length: 11.1 cm. Echogenicity within normal limits. No mass or hydronephrosis visualized. Abdominal aorta: No aneurysm visualized. Other findings: None. IMPRESSION: Negative abdominal ultrasound. Electronically Signed   By: Sebastian Ache M.D.   On: 06/04/2019 20:22      Assessment/Plan Active Problems:   Nausea/vomiting in pregnancy   # Epigastric pain with nausea/vomiting - likely nausea/vomiting of pregnancy. Hx recurrent bouts of similar pain in conjunction w/ chronic marijuana use suggests possible cyclic vomiting. That nausea/vomiting predated start of epigastric pain suggests possible mallory weiss tear. Hx heavy alcohol use also raises possible of ulcer disease. Abd u/s unremarkable. Trace bleeding with vomiting. Think mild lab abnormalities (hypokalemia, mildly elevated lipase and ALT) are likely sequelae of this nausea/vomiting. UDS and etoh neg - advise  standard treatment of nausea/vomiting in pregnancy. No chest pain or doe to suggest cardiac etiology, though given hx a-fib may warrant ACS w/u if ekg abnormal or new cardiac symptoms present - in addition advise starting acid suppression with pantoprazole 40 mg BID - consider GI consult if epigastric pain worsens despite adequate treatment of n/v, or if significant upper GI bleeding - f/u urine culture (no clear uti symptoms; u/a equivocal) - repeat CMP in AM  # Acute encephalopathy - somnolent and slurring speech on initial eval, though more alert and fluent after several minutes of questioning. No sig metabolic abnormalities on labs thus far, uds and ethanol level are neg. No apparent focal neurologic symptoms - osteo eval as below - ordering ammonia level, salicylates, acetaminophen level, tsh - consider CT head if symptoms persist/worsen  # Thoracic back pain - several days of severe thoracic back pain with significant tenderness of thoracic spinous processes. No LE weakness, difficulty ambulating, bowel habit changes. Could be MSK 2/2 frequent heaving. - think prudent to check MRI to evaluate for problems such as osteomyelitis, and abscess. I have placed the order. Radiology thinks non-contrast study will be sufficient and is preferable given pregnancy - I have ordered 2 sets of blood culture and also SED and CRP  # Hypokalemia - likely 2/2 vomiting - replete potassium, given hx a-fib advise keeping K between 4 and 5 - f/u magnesium level, recommend keeping at or slightly above 2  # History atrial fibrillation - seen 01/2019 @ [facility] ED in setting of nausea and vomiting after binge drinking. Resolved to NSR in the ED and thought to be 2/2 acute illness. However, pt complains of intermittent palpitations - I have ordered an ekg (also helpful given hypokalemia) - advise telemetry monitoring, at least overnight  # Intrauterine pregnancy - defer mgmt to primary team    Significant  Diagnostic Studies: radiology: CT scan: normal head CT and Ultrasound: 10.2 week IUP  Treatments: IV hydration and potassium repletion, Protonix IV  Discharge Exam: Blood pressure 112/62, pulse 94, temperature 98.4 F (36.9 C), temperature source Oral, resp. rate 18, height  (1.702 m), weight 75.8 kg, last menstrual period 03/09/2019, SpO2 100 %. General appearance: alert, cooperative and no distress Resp: normal effort GI: no distension  Disposition: Discharge disposition: 01-Home or Self Care       Discharge  Instructions    Discharge patient   Complete by: As directed    Discharge disposition: 01-Home or Self Care   Discharge patient date: 06/07/2019     Allergies as of 06/07/2019   No Known Allergies     Medication List    STOP taking these medications   famotidine 20 MG tablet Commonly known as: PEPCID   naproxen sodium 220 MG tablet Commonly known as: ALEVE     TAKE these medications   acetaminophen 500 MG tablet Commonly known as: TYLENOL Take 1,000 mg by mouth every 6 (six) hours as needed for mild pain or headache.   calcium carbonate 500 MG chewable tablet Commonly known as: TUMS - dosed in mg elemental calcium Chew 1-2 tablets by mouth 5 (five) times daily as needed for indigestion or heartburn.   Doxylamine-Pyridoxine 10-10 MG Tbec Take 2 tablets by mouth daily.   esomeprazole 20 MG capsule Commonly known as: NEXIUM Take 20 mg by mouth daily at 12 noon.   ibuprofen 600 MG tablet Commonly known as: ADVIL Take 1 tablet (600 mg total) by mouth every 6 (six) hours as needed.   Icy Hot Advanced Relief 16-11 % Crea Generic drug: Menthol-Camphor Apply 1 application topically 3 (three) times daily as needed (For back pain.).   Melatonin 10 MG Tabs Take 1 tablet by mouth at bedtime.   metroNIDAZOLE 500 MG tablet Commonly known as: FLAGYL Take 1 tablet (500 mg total) by mouth 2 (two) times daily.   prenatal multivitamin Tabs tablet Take 1  tablet by mouth daily at 12 noon.   promethazine 25 MG tablet Commonly known as: PHENERGAN Take 1 tablet (25 mg total) by mouth every 6 (six) hours as needed for nausea or vomiting.        Signed: Scheryl Darter 06/07/2019, 10:45 AM

## 2019-06-07 NOTE — Discharge Instructions (Signed)
Hyperemesis Gravidarum °Hyperemesis gravidarum is a severe form of nausea and vomiting that happens during pregnancy. Hyperemesis is worse than morning sickness. It may cause you to have nausea or vomiting all day for many days. It may keep you from eating and drinking enough food and liquids, which can lead to dehydration, malnutrition, and weight loss. Hyperemesis usually occurs during the first half (the first 20 weeks) of pregnancy. It often goes away once a woman is in her second half of pregnancy. However, sometimes hyperemesis continues through an entire pregnancy. °What are the causes? °The cause of this condition is not known. It may be related to changes in chemicals (hormones) in the body during pregnancy, such as the high level of pregnancy hormone (human chorionic gonadotropin) or the increase in the female sex hormone (estrogen). °What are the signs or symptoms? °Symptoms of this condition include: °· Nausea that does not go away. °· Vomiting that does not allow you to keep any food down. °· Weight loss. °· Body fluid loss (dehydration). °· Having no desire to eat, or not liking food that you have previously enjoyed. °How is this diagnosed? °This condition may be diagnosed based on: °· A physical exam. °· Your medical history. °· Your symptoms. °· Blood tests. °· Urine tests. °How is this treated? °This condition is managed by controlling symptoms. This may include: °· Following an eating plan. This can help lessen nausea and vomiting. °· Taking prescription medicines. °An eating plan and medicines are often used together to help control symptoms. If medicines do not help relieve nausea and vomiting, you may need to receive fluids through an IV at the hospital. °Follow these instructions at home: °Eating and drinking ° °· Avoid the following: °? Drinking fluids with meals. Try not to drink anything during the 30 minutes before and after your meals. °? Drinking more than 1 cup of fluid at a  time. °? Eating foods that trigger your symptoms. These may include spicy foods, coffee, high-fat foods, very sweet foods, and acidic foods. °? Skipping meals. Nausea can be more intense on an empty stomach. If you cannot tolerate food, do not force it. Try sucking on ice chips or other frozen items and make up for missed calories later. °? Lying down within 2 hours after eating. °? Being exposed to environmental triggers. These may include food smells, smoky rooms, closed spaces, rooms with strong smells, warm or humid places, overly loud and noisy rooms, and rooms with motion or flickering lights. Try eating meals in a well-ventilated area that is free of strong smells. °? Quick and sudden changes in your movement. °? Taking iron pills and multivitamins that contain iron. If you take prescription iron pills, do not stop taking them unless your health care provider approves. °? Preparing food. The smell of food can spoil your appetite or trigger nausea. °· To help relieve your symptoms: °? Listen to your body. Everyone is different and has different preferences. Find what works best for you. °? Eat and drink slowly. °? Eat 5-6 small meals daily instead of 3 large meals. Eating small meals and snacks can help you avoid an empty stomach. °? In the morning, before getting out of bed, eat a couple of crackers to avoid moving around on an empty stomach. °? Try eating starchy foods as these are usually tolerated well. Examples include cereal, toast, bread, potatoes, pasta, rice, and pretzels. °? Include at least 1 serving of protein with your meals and snacks. Protein options include   lean meats, poultry, seafood, beans, nuts, nut butters, eggs, cheese, and yogurt. °? Try eating a protein-rich snack before bed. Examples of a protein-rick snack include cheese and crackers or a peanut butter sandwich made with 1 slice of whole-wheat bread and 1 tsp (5 g) of peanut butter. °? Eat or suck on things that have ginger in them.  It may help relieve nausea. Add ¼ tsp ground ginger to hot tea or choose ginger tea. °? Try drinking 100% fruit juice or an electrolyte drink. An electrolyte drink contains sodium, potassium, and chloride. °? Drink fluids that are cold, clear, and carbonated or sour. Examples include lemonade, ginger ale, lemon-lime soda, ice water, and sparkling water. °? Brush your teeth or use a mouth rinse after meals. °? Talk with your health care provider about starting a supplement of vitamin B6. °General instructions °· Take over-the-counter and prescription medicines only as told by your health care provider. °· Follow instructions from your health care provider about eating or drinking restrictions. °· Continue to take your prenatal vitamins as told by your health care provider. If you are having trouble taking your prenatal vitamins, talk with your health care provider about different options. °· Keep all follow-up and pre-birth (prenatal) visits as told by your health care provider. This is important. °Contact a health care provider if: °· You have pain in your abdomen. °· You have a severe headache. °· You have vision problems. °· You are losing weight. °· You feel weak or dizzy. °Get help right away if: °· You cannot drink fluids without vomiting. °· You vomit blood. °· You have constant nausea and vomiting. °· You are very weak. °· You faint. °· You have a fever and your symptoms suddenly get worse. °Summary °· Hyperemesis gravidarum is a severe form of nausea and vomiting that happens during pregnancy. °· Making some changes to your eating habits may help relieve nausea and vomiting. °· This condition may be managed with medicine. °· If medicines do not help relieve nausea and vomiting, you may need to receive fluids through an IV at the hospital. °This information is not intended to replace advice given to you by your health care provider. Make sure you discuss any questions you have with your health care  provider. °Document Released: 11/04/2005 Document Revised: 11/24/2017 Document Reviewed: 07/03/2016 °Elsevier Patient Education © 2020 [employer] ° ° °

## 2019-06-09 LAB — CULTURE, BLOOD (ROUTINE X 2)
Culture: NO GROWTH
Culture: NO GROWTH
Special Requests: ADEQUATE
Special Requests: ADEQUATE

## 2019-06-09 LAB — GC/CHLAMYDIA PROBE AMP (~~LOC~~) NOT AT ARMC
Chlamydia: NEGATIVE
Neisseria Gonorrhea: NEGATIVE

## 2019-06-17 LAB — DRUG SCREEN 10 W/CONF, SERUM
Amphetamines, IA: NEGATIVE ng/mL
Barbiturates, IA: NEGATIVE ug/mL
Benzodiazepines, IA: NEGATIVE ng/mL
Cocaine & Metabolite, IA: NEGATIVE ng/mL
Methadone, IA: NEGATIVE ng/mL
Opiates, IA: NEGATIVE ng/mL
Oxycodones, IA: NEGATIVE ng/mL
Phencyclidine, IA: NEGATIVE ng/mL
Propoxyphene, IA: NEGATIVE ng/mL
THC(Marijuana) Metabolite, IA: POSITIVE ng/mL — AB

## 2019-06-17 LAB — THC,MS,WB/SP RFX
Cannabidiol: NEGATIVE ng/mL
Cannabinoid Confirmation: POSITIVE
Cannabinol: NEGATIVE ng/mL
Carboxy-THC: 16.9 ng/mL
Hydroxy-THC: NEGATIVE ng/mL
Tetrahydrocannabinol(THC): 2.5 ng/mL

## 2021-01-05 IMAGING — US ULTRASOUND ABDOMEN COMPLETE
1 series · 15 of 25 positions shown · non-contrast
Comparison: None.

CLINICAL DATA: Abdominal pain, nausea, and vomiting.

EXAM:
ABDOMEN ULTRASOUND COMPLETE

[Series 1: ultrasound abdomen complete · 15 of 74 slices shown]
[im 1/74]
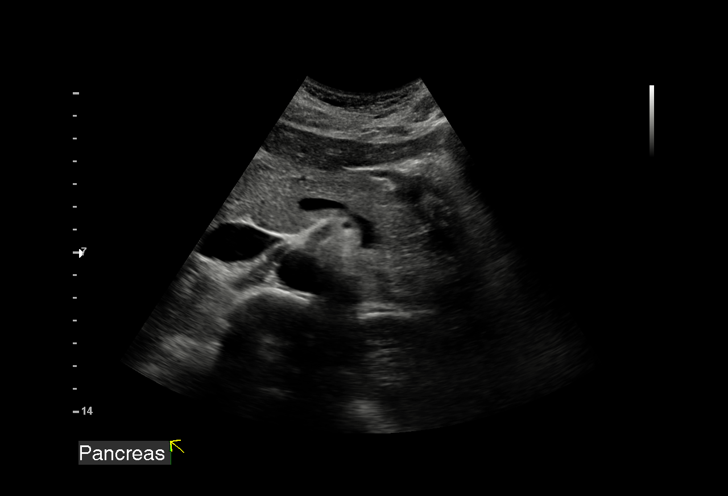
[im 7/74]
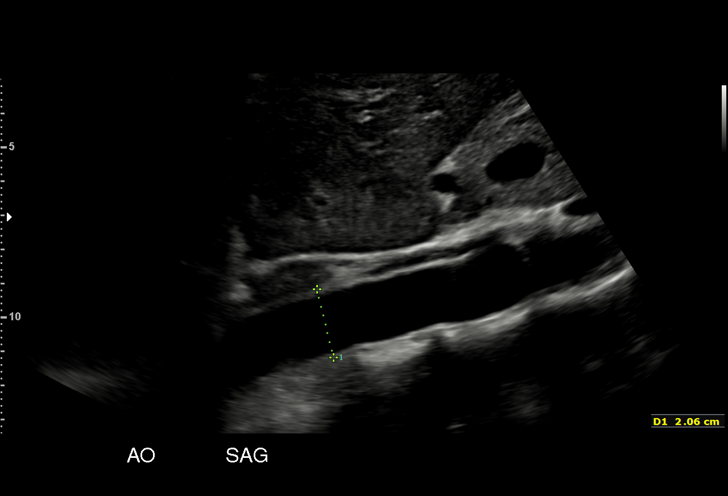
[im 13/74]
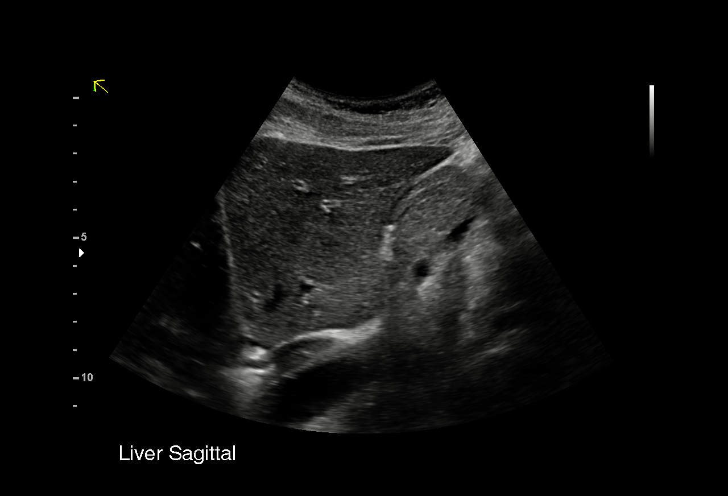
[im 16/74]
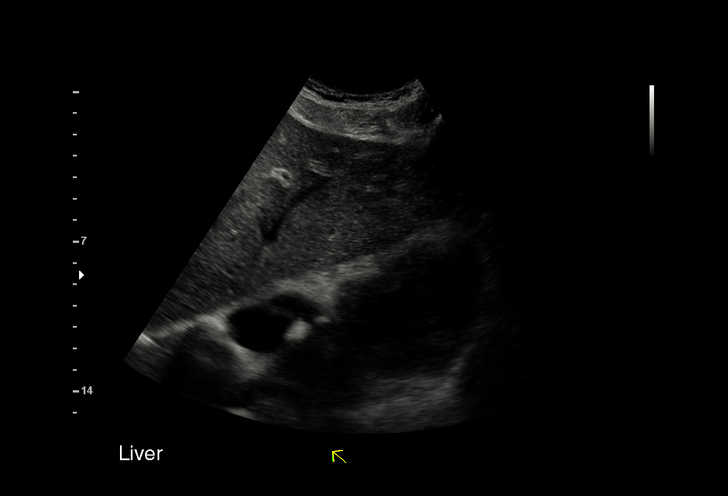
[im 22/74]
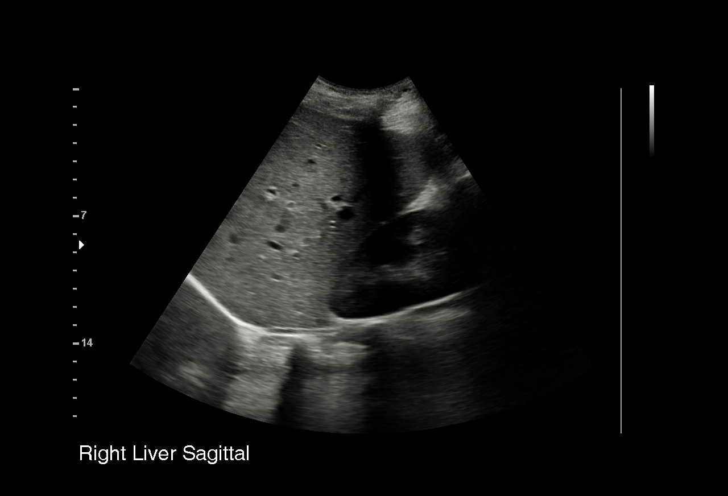
[im 28/74]
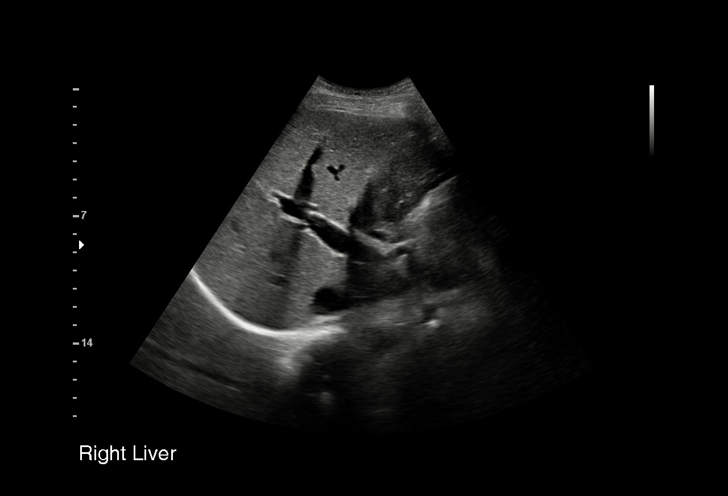
[im 31/74]
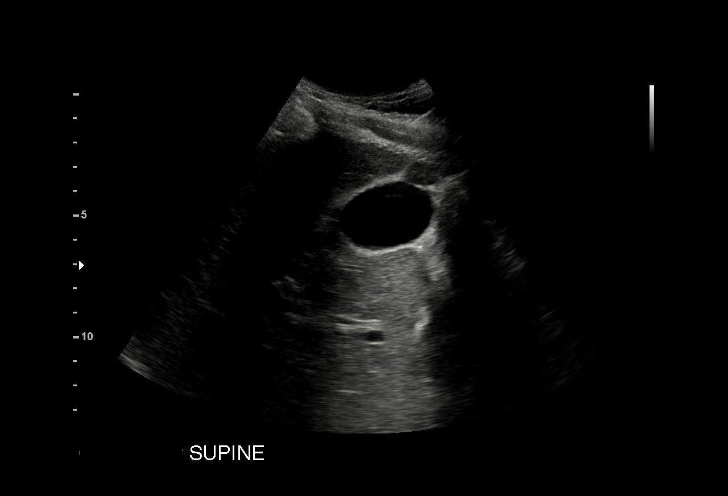
[im 37/74]
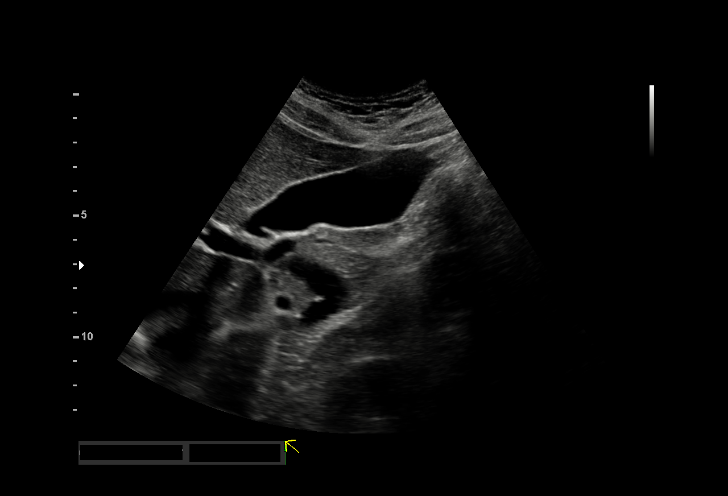
[im 43/74]
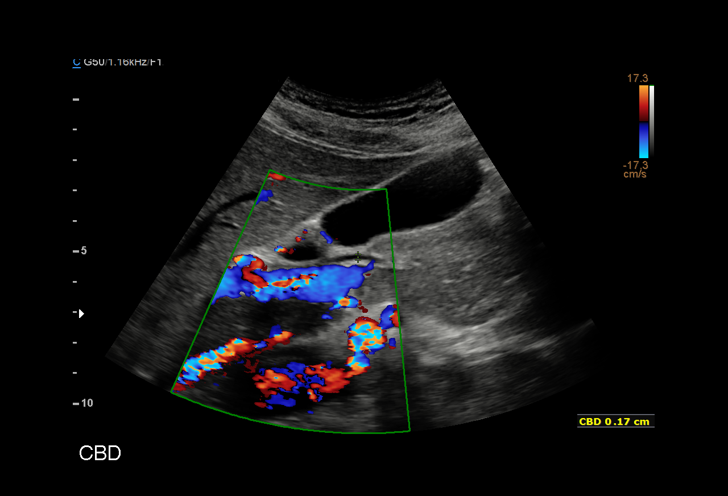
[im 46/74]
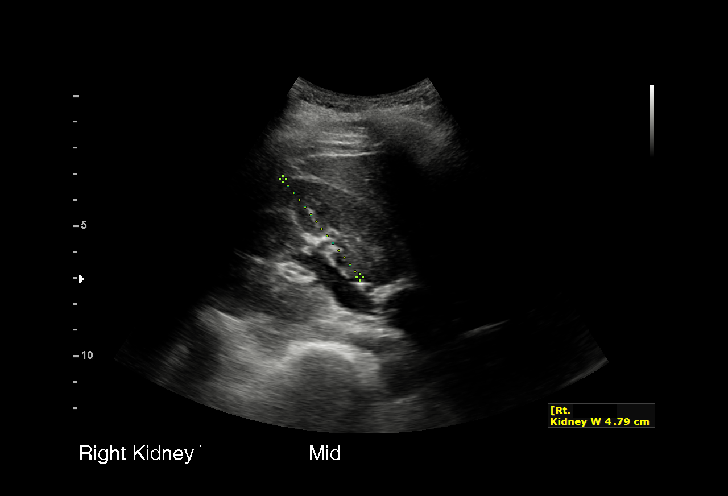
[im 52/74]
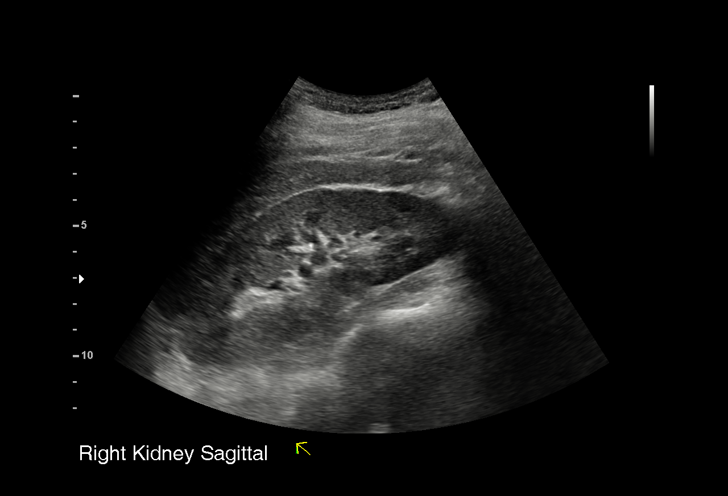
[im 58/74]
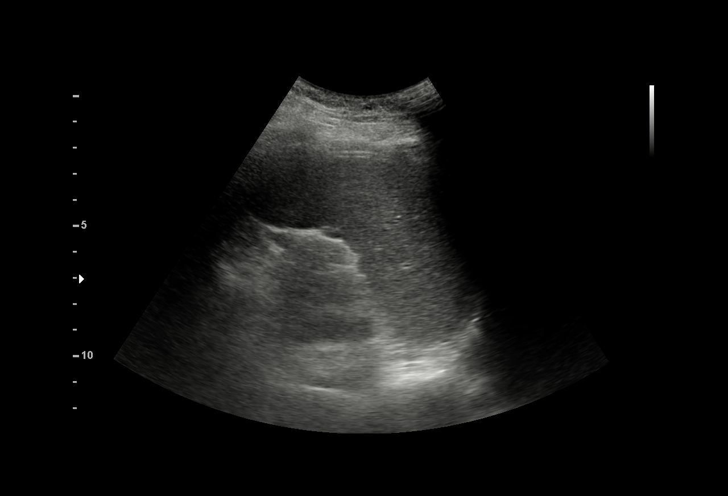
[im 61/74]
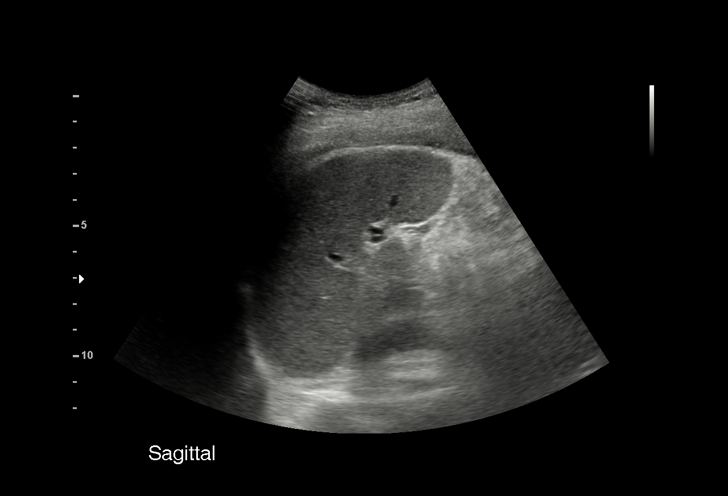
[im 67/74]
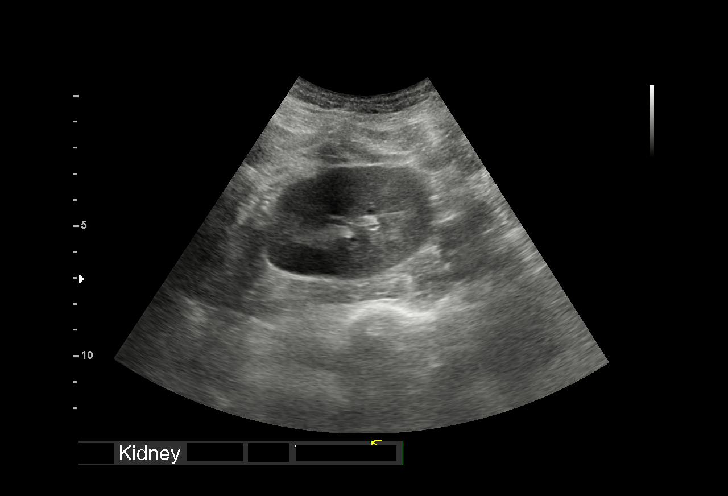
[im 74/74]
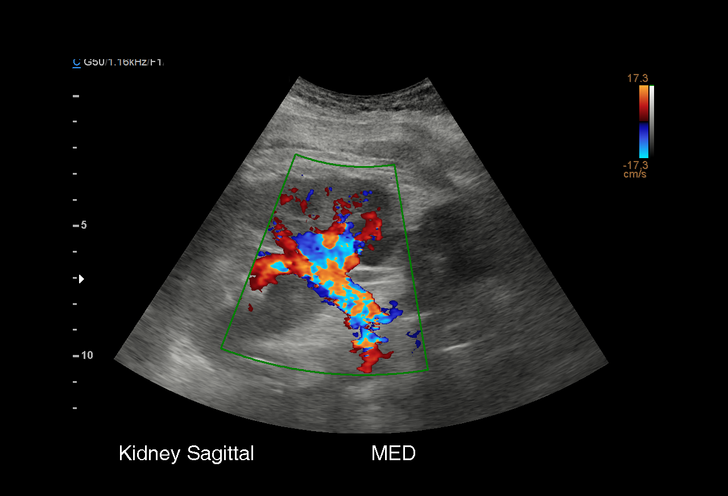

[15 of 25 positions shown; findings below may reference images not displayed]

FINDINGS: Examination was technically difficult due to motion artifact as well
as patient's inability to take a deep breath.

Gallbladder: No gallstones or wall thickening visualized. No
sonographic Murphy sign noted by sonographer.

Common bile duct: Diameter: 2 mm

Liver: No focal lesion identified. Within normal limits in
parenchymal echogenicity. Portal vein is patent on color Doppler
imaging with normal direction of blood flow towards the liver.

IVC: No abnormality visualized.

Pancreas: Visualized portion unremarkable.

Spleen: Size and appearance within normal limits.

Right Kidney: Length: 12.1 cm. Echogenicity within normal limits. No
mass or hydronephrosis visualized.

Left Kidney: Length: 11.1 cm. Echogenicity within normal limits. No
mass or hydronephrosis visualized.

Abdominal aorta: No aneurysm visualized.

Other findings: None.
IMPRESSION: Negative abdominal ultrasound.

## 2021-01-06 IMAGING — CT CT HEAD WITHOUT CONTRAST
4 series · 16 of 47 positions shown, 18 images · non-contrast
Comparison: None.

CLINICAL DATA: Somnolence.  Early pregnancy.

EXAM:
CT HEAD WITHOUT CONTRAST
TECHNIQUE: Contiguous axial images were obtained from the base of the skull
through the vertex without intravenous contrast.

[Series 3: sag soft · sagittal · 0.35mm/px · 3 of 59 slices shown]
[im 20/59  brain]
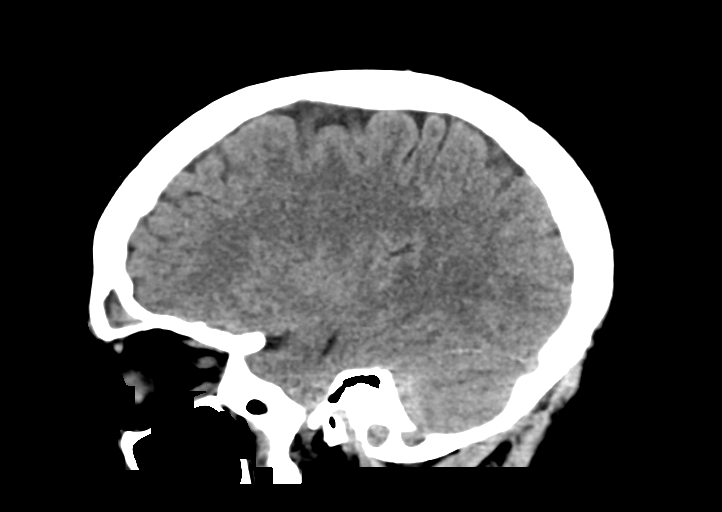
[im 30/59  brain]
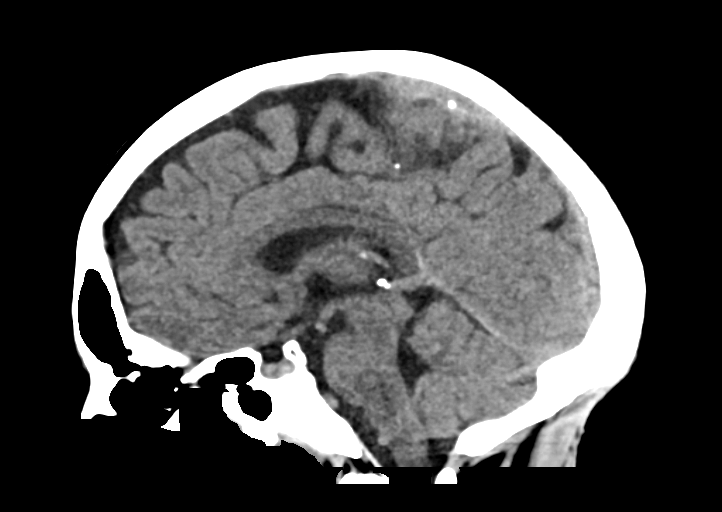
[im 39/59  brain]
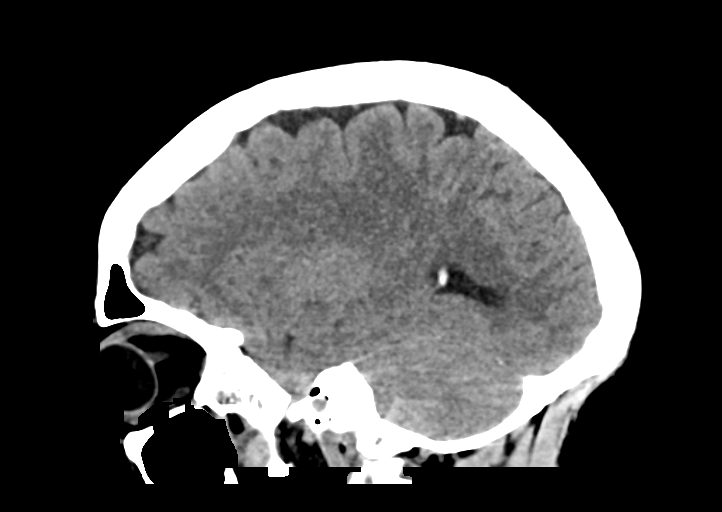

[Series 4: head wo · axial · 0.46mm/px · z∈[-199,-74]mm · 7 of 35 slices shown, 9 images]
[im 5/35  brain]
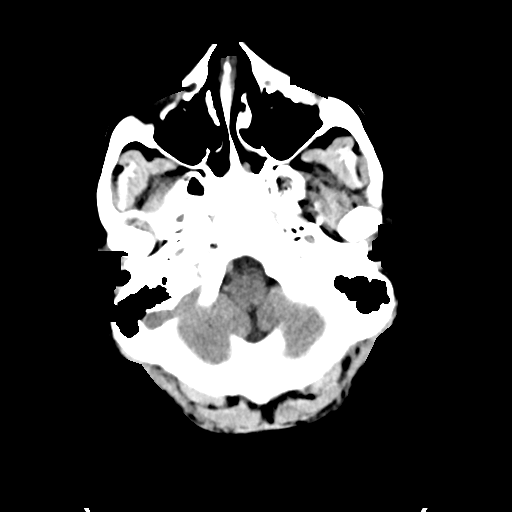
[im 5/35  bone]
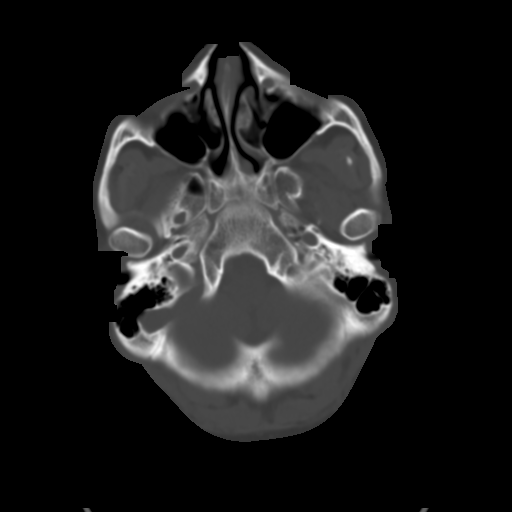
[im 9/35  brain]
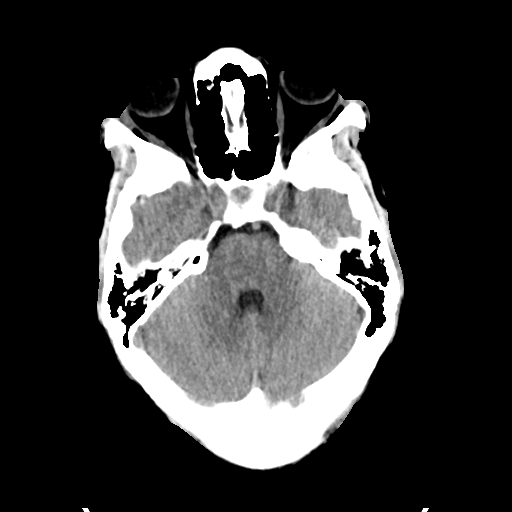
[im 13/35  brain]
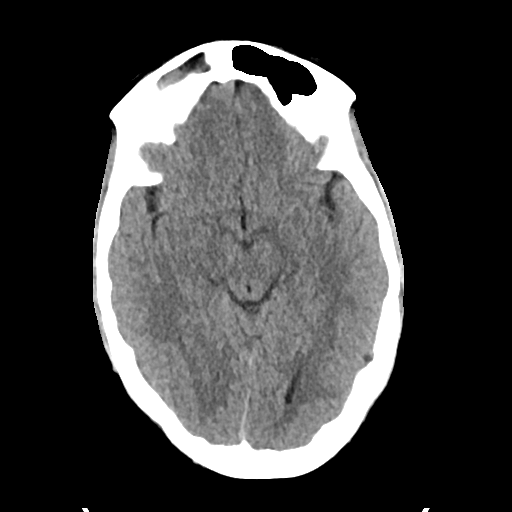
[im 18/35  brain]
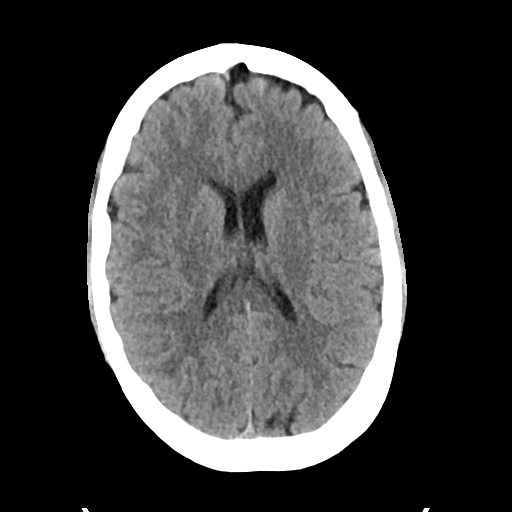
[im 22/35  brain]
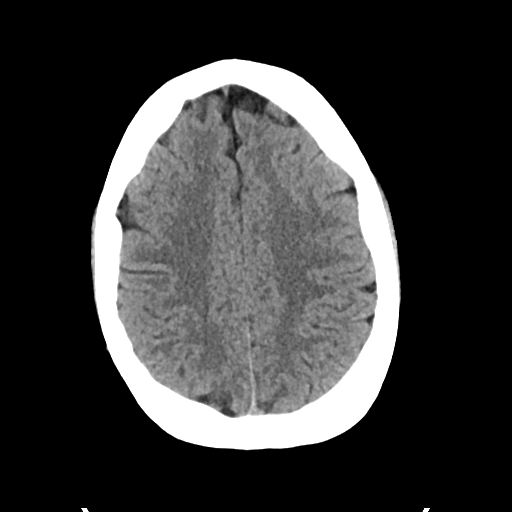
[im 22/35  bone]
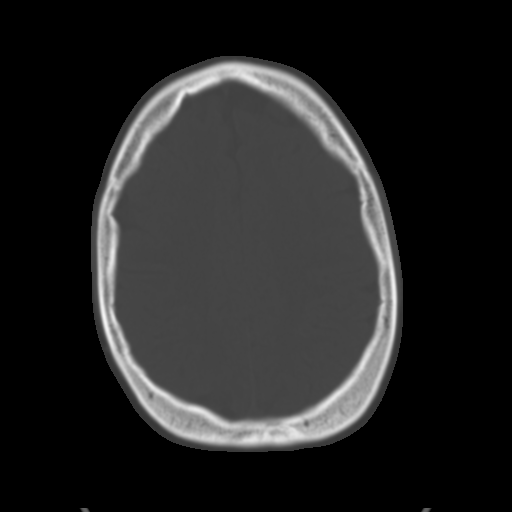
[im 26/35  brain]
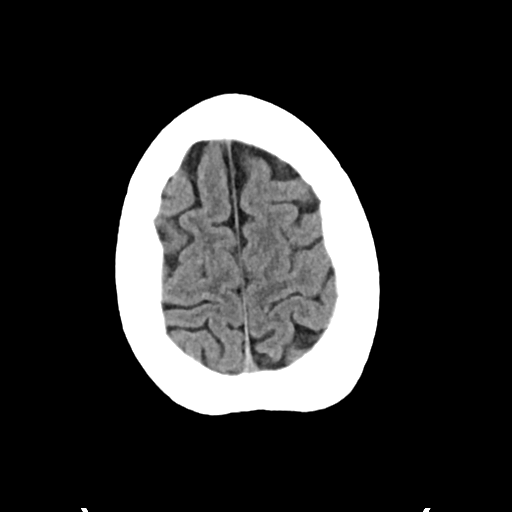
[im 30/35  brain]
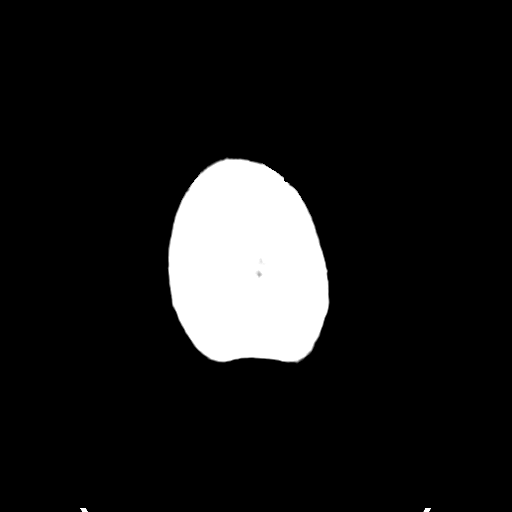

[Series 5: head bone · axial · 0.46mm/px · z∈[-203,-169]mm · 3 of 87 slices shown]
[im 9/87  bone]
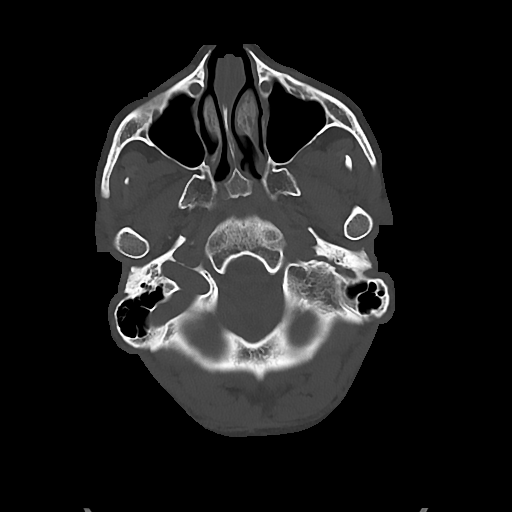
[im 18/87  bone]
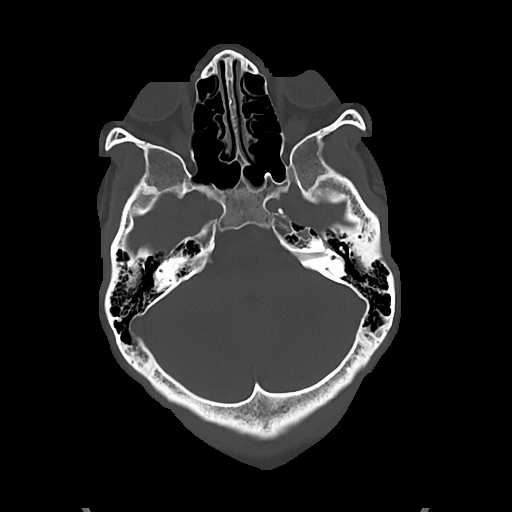
[im 26/87  bone]
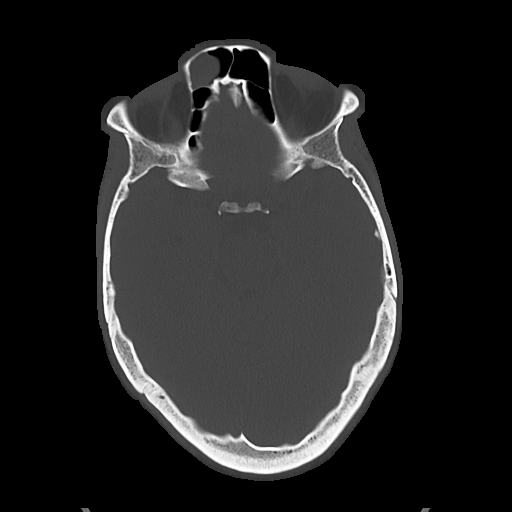

[Series 6: cor soft · coronal · 0.34mm/px · 3 of 74 slices shown]
[im 25/74  brain]
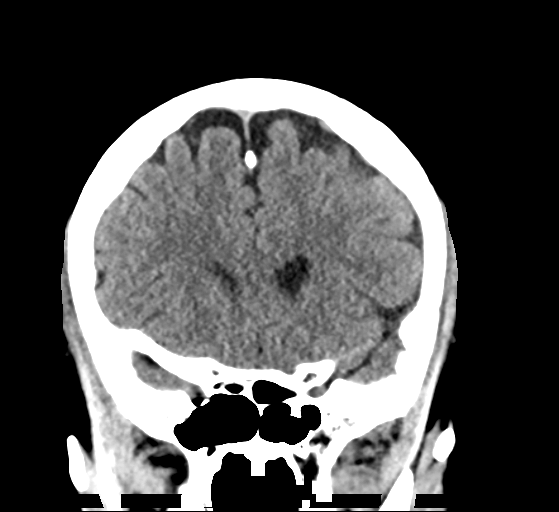
[im 33/74  brain]
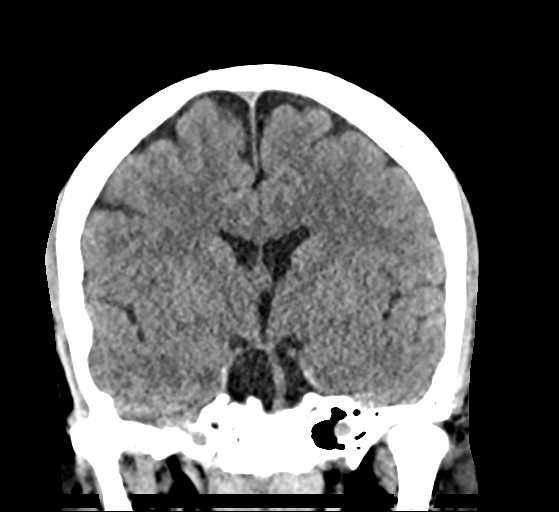
[im 41/74  brain]
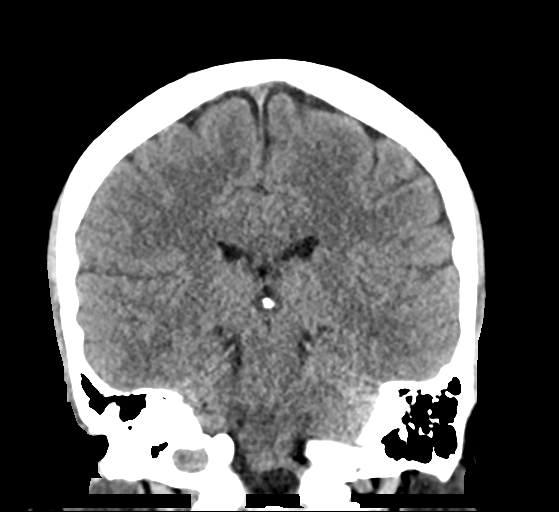

[16 of 47 positions shown; findings below may reference images not displayed]

FINDINGS: Brain: The brain shows a normal appearance without evidence of
malformation, atrophy, old or acute small or large vessel
infarction, mass lesion, hemorrhage, hydrocephalus or extra-axial
collection.

Vascular: No hyperdense vessel. No evidence of atherosclerotic
calcification.

Skull: Normal.  No traumatic finding.  No focal bone lesion.

Sinuses/Orbits: Sinuses are clear except for a retention cyst the
right frontal sinus. Orbits appear normal. Mastoids are clear.

Other: None significant
IMPRESSION: Normal head CT
